# Patient Record
Sex: Male | Born: 1945 | Race: White | Hispanic: No
Health system: Midwestern US, Community
[De-identification: ages and names within clinical notes are randomized; demographics above are authoritative.]

## PROBLEM LIST (undated history)

## (undated) DIAGNOSIS — G4733 Obstructive sleep apnea (adult) (pediatric): Secondary | ICD-10-CM

## (undated) DIAGNOSIS — J449 Chronic obstructive pulmonary disease, unspecified: Secondary | ICD-10-CM

## (undated) DIAGNOSIS — K219 Gastro-esophageal reflux disease without esophagitis: Secondary | ICD-10-CM

## (undated) DIAGNOSIS — J42 Unspecified chronic bronchitis: Secondary | ICD-10-CM

---

## 2013-08-23 NOTE — Progress Notes (Signed)
PULMONARY/SLEEP MEDICINE    08/23/2013     CHIEF COMPLAINT: had a chief complaint of COPD.    HISTORY OF PRESENT ILLNESS: Donald Charles presents for had a chief complaint of COPD.   Doing well since his last visit.  Minimal cough.  No phlegm, wheezing, chest pain.  No sinus complaints.  No heartburn symptoms.  Using rescue inhaler about 10x/wk    Using O2 with machine with exertion  11-7 am  occ naps  Sleeping well with his machine.  Wakes up feeling rested.  No daytime fatigue.  Pressure in mask is comfortable.      Past Medical History   Diagnosis Date   ??? Hypertension    ??? Hyperlipidemia    ??? BPH (benign prostatic hyperplasia)    ??? COPD (chronic obstructive pulmonary disease)    ??? OSA on CPAP      No past surgical history on file.  Current Outpatient Prescriptions   Medication Sig Dispense Refill   ??? cpap machine kit by Does Not Apply route. At 12       ??? OXYGEN-AIR DELIVERY SYSTEMS (KEYSTONE + OXYGEN CONCENTRATOR) by Does Not Apply route. At 2L/min with exertion and at night       ??? albuterol (PROVENTIL HFA, VENTOLIN HFA, PROAIR HFA) 90 mcg/actuation inhaler Take 2 puffs by inhalation every four (4) hours as needed.  3 Inhaler  3   ??? fluticasone-salmeterol (ADVAIR DISKUS) 250-50 mcg/dose diskus inhaler Take 1 puff by inhalation every twelve (12) hours.  3 Inhaler  3   ??? furosemide (LASIX) 40 mg tablet Take  by mouth daily.       ??? fenofibrate nanocrystallized (TRICOR) 145 mg tablet Take  by mouth daily.       ??? irbesartan (AVAPRO) 300 mg tablet Take 300 mg by mouth nightly.       ??? metolazone (ZAROXOLYN) 2.5 mg tablet Take  by mouth daily.       ??? atorvastatin (LIPITOR) 20 mg tablet Take  by mouth daily.       ??? metoprolol succinate (TOPROL-XL) 100 mg XL tablet Take  by mouth daily.         I attest that current medications are reviewed and are accurate.    No Known Allergies  History     Social History   ??? Marital Status: DIVORCED     Spouse Name: N/A     Number of Children: N/A   ??? Years of Education: N/A      Social History Main Topics   ??? Smoking status: Former Smoker -- 1.00 packs/day for 30 years   ??? Smokeless tobacco: Not on file      Comment: quit 15 yrs ago   ??? Alcohol Use: Yes      Comment: occasional   ??? Drug Use: Not on file   ??? Sexually Active: Not on file     Other Topics Concern   ??? Not on file     Social History Narrative   ??? No narrative on file     Family History   Problem Relation Age of Onset   ??? Cancer Mother      lung       REVIEW OF SYSTEMS:   no dyspnea.  no hemoptysis.  no cough.  no orthopnea.  no wheeze.  no sputum production. no fever, sweats, or chills.  no unusual fatigue.  no loss of appetite.  no weight loss more than 5 lbs.  no headaches.  no ear aches.  no eye irritation.  no blurred or double vision.  no nose or sinus problems, including hay fever.  no dry eyes or dry mouth.  no snoring.  no breast discomfort.  no chest pain.  no irregular or rapid heart beats.  no heartburn or indigestion.  no difficulty swallowing or regurgitation.  no nausea or vomiting.  no abdominl pain.  no diarrhea.  no constipation.  no difficult or painful urination.  no frequent urination.  no swelling at the ankles.  no joint pains or muscle aches.  no fingers turn white and painful in the cold.  no back pain or neck pain.  no automobile accident or other serios injury.  no unusual dizziness, faintness or loss of consciousness.  no numbness or weakness of part of your body.  no anxiety.  no depression.        PHYSICAL EXAM: BP 136/74   Pulse 67   Ht 6' (1.829 m)   Wt 156.945 kg (346 lb)   BMI 46.92 kg/m2   SpO2 91%   General Appearance: NAD, pleasant, well built and nourished  HEENT: no thrush, no mucositis, TM's normal, turbinates normal, oropharynx normal  Oral Cavity: normal teeth, no lesions  Neck, Thyroid: supple, no lymphadenopathy, trachea at midline, no thyromegaly, JVP flat  Heart: regular rate and rhythm, S1, S2 without murmur.    Lungs: clear to auscultation, good air entry bilaterally.    Chest:  normal shape and expansion, no use of accessory muscles, clear to percussion, normal fremitus  Abdomen: soft, NT/ND, BS present, no masses palpated, no hepatosplenomegaly  Extremities: no cyanosis, no clubbing, no edema.    Peripheral pulses: normal (2+) bilaterally  Neurologic: no focal signs, awake and alert, oriented x 3, normal cranial nerves II-XII sensory and motor wnl, DTR 2 plus, gait normal, normal sensation and strength  Lymph nodes not palpable  Skin: warm, dry, normal, no rash  Back: no midline or CVA tenderness  Lymphatics: lymphoedema absent      LABS AND TESTING:   No results found for this or any previous visit.    ASSESSMENT    ICD-9-CM   1. COPD (chronic obstructive pulmonary disease) 496   2. Sleep apnea, obstructive 327.23           PLAN  10/14 patient seen for follow-up.  Patient is doing well since last time.  His breathing is stable at this time.  We will continue the same inhalers.  He is doing well with his CPAP machine.  I will give him an envelope to mail back his SD card and we will check his AHI data.  Follow-up in 6 months.  Patient does not want the flu shot or pneumonia shot  Orders Placed This Encounter   ??? albuterol (PROVENTIL HFA, VENTOLIN HFA, PROAIR HFA) 90 mcg/actuation inhaler   ??? fluticasone-salmeterol (ADVAIR DISKUS) 250-50 mcg/dose diskus inhaler           Follow-up Disposition:  Return in about 6 months (around 02/21/2014).  Thayer Dallas, MD

## 2013-08-23 NOTE — Patient Instructions (Signed)
Sleep Apnea: After Your Visit  Your Care Instructions  Sleep apnea means that you frequently stop breathing for 10 seconds or longer during sleep. It can be mild to severe, based on the number of times an hour that you stop breathing or have slowed breathing.  Blocked or narrowed airways in your nose, mouth, or throat can cause sleep apnea. Your airway can become blocked when your throat muscles and tongue relax during sleep.  You can treat sleep apnea at home by making lifestyle changes. You also can use a CPAP breathing machine that keeps tissues in the throat from blocking your airway. Or your doctor may suggest that you use a breathing device while you sleep. It helps keep your airway open. This could be a device that you put in your mouth. Other examples include strips or disks that you use on your nose. In some cases, surgery may be needed to remove enlarged tissues in the throat.  Follow-up care is a key part of your treatment and safety. Be sure to make and go to all appointments, and call your doctor if you are having problems. It's also a good idea to know your test results and keep a list of the medicines you take.  How can you care for yourself at home?  ?? Lose weight, if needed. It may reduce the number of times you stop breathing or have slowed breathing.  ?? Sleep on your side. It may stop mild apnea. If you tend to roll onto your back, sew a pocket in the back of your pajama top. Put a tennis ball into the pocket, and stitch the pocket shut. This will help keep you from sleeping on your back.  ?? Avoid alcohol and medicines such as sleeping pills and sedatives before bed.  ?? Do not smoke. Smoking can make sleep apnea worse. If you need help quitting, talk to your doctor about stop-smoking programs and medicines. These can increase your chances of quitting for good.  ?? Prop up the head of your bed 4 to 6 inches by putting bricks under the legs of the bed.  ?? Treat breathing problems, such as a stuffy  nose, caused by a cold or allergies.  ?? Try a continuous positive airway pressure (CPAP) breathing machine if your doctor recommends it. The machine keeps your airway open when you sleep.  ?? If CPAP does not work for you, ask your doctor if you can try other breathing machines. A bilevel positive airway pressure machine uses one type of air pressure for breathing in and another type for breathing out. Another device raises or lowers air pressure as needed while you breathe.  ?? Talk to your doctor if:  ?? Your nose feels dry or bleeds when you use one of these machines. You may need to increase moisture in the air. A humidifier may help.  ?? Your nose is runny or stuffy from using a breathing machine. Decongestants or a corticosteroid nasal spray may help.  ?? You are sleepy during the day and it gets in the way of the normal things you do. Do not drive when you are drowsy.  When should you call for help?  Watch closely for changes in your health, and be sure to contact your doctor if:  ?? You still have sleep apnea even though you have made lifestyle changes.  ?? You are thinking of trying a device such as CPAP.  ?? You are having problems using a CPAP or similar machine.     Where can you learn more?   Go to http://www.healthwise.net/[facility]  Enter J936 in the search box to learn more about "Sleep Apnea: After Your Visit."   ?? 2006-2014 Healthwise, Inc[employer]. Care instructions adapted under license by [facility] (which disclaims liability or warranty for this information). This care instruction is for use with your licensed healthcare professional. If you have questions about a medical condition or this instruction, always ask your healthcare professional. Healthwise, Incorporated disclaims any warranty or liability for your use of this information.  Content Version: 10.1.311062; Current as of: November 21, 2012

## 2013-09-05 NOTE — Telephone Encounter (Signed)
Please let the patient know that their sleep apnea is well controlled at their current setting.

## 2013-09-05 NOTE — Telephone Encounter (Signed)
Left message to discuss

## 2013-09-06 NOTE — Telephone Encounter (Signed)
Patient informed

## 2014-02-19 MED ORDER — ALBUTEROL SULFATE HFA 90 MCG/ACTUATION AEROSOL INHALER
90 mcg/actuation | RESPIRATORY_TRACT | Status: DC | PRN
Start: 2014-02-19 — End: 2014-05-28

## 2014-02-19 NOTE — Progress Notes (Signed)
PULMONARY/SLEEP MEDICINE    02/19/2014     CHIEF COMPLAINT: had concerns including COPD.    HISTORY OF PRESENT ILLNESS: Donald Charles presents for had concerns including COPD.    Doing well  No sob, cough, phlegm, wheezing  Using rescue inhaler about twice a day  Using O2 with exertion  Sleeping well at this time  occ naps  No trouble driving  Doesn't want pneumonia shot        Past Medical History   Diagnosis Date   ??? Hypertension    ??? Hyperlipidemia    ??? BPH (benign prostatic hyperplasia)    ??? COPD (chronic obstructive pulmonary disease) ([place])    ??? OSA on CPAP      History reviewed. No pertinent past surgical history.  Current Outpatient Prescriptions   Medication Sig Dispense Refill   ??? cpap machine kit by Does Not Apply route. At 12       ??? OXYGEN-AIR DELIVERY SYSTEMS (KEYSTONE + OXYGEN CONCENTRATOR) by Does Not Apply route. At 2L/min with exertion and at night       ??? albuterol (PROVENTIL HFA, VENTOLIN HFA, PROAIR HFA) 90 mcg/actuation inhaler Take 2 puffs by inhalation every four (4) hours as needed.  3 Inhaler  3   ??? fluticasone-salmeterol (ADVAIR DISKUS) 250-50 mcg/dose diskus inhaler Take 1 puff by inhalation every twelve (12) hours.  3 Inhaler  3   ??? furosemide (LASIX) 40 mg tablet Take  by mouth daily.       ??? fenofibrate nanocrystallized (TRICOR) 145 mg tablet Take  by mouth daily.       ??? irbesartan (AVAPRO) 300 mg tablet Take 300 mg by mouth nightly.       ??? metolazone (ZAROXOLYN) 2.5 mg tablet Take  by mouth daily.       ??? atorvastatin (LIPITOR) 20 mg tablet Take  by mouth daily.       ??? metoprolol succinate (TOPROL-XL) 100 mg XL tablet Take  by mouth daily.         I attest that current medications are reviewed and are accurate.    No Known Allergies  History     Social History   ??? Marital Status: DIVORCED     Spouse Name: N/A     Number of Children: N/A   ??? Years of Education: N/A     Social History Main Topics   ??? Smoking status: Former Smoker -- 1.00 packs/day for 30 years   ??? Smokeless tobacco:  Not on file      Comment: quit 15 yrs ago   ??? Alcohol Use: Yes      Comment: occasional   ??? Drug Use: Not on file   ??? Sexual Activity: Not on file     Other Topics Concern   ??? Not on file     Social History Narrative     Family History   Problem Relation Age of Onset   ??? Cancer Mother      lung       REVIEW OF SYSTEMS:   no dyspnea.  no hemoptysis.  no cough.  no orthopnea.  no wheeze.  no sputum production. no fever, sweats, or chills.  no unusual fatigue.  no loss of appetite.  no weight loss more than 5 lbs.  no headaches.  no ear aches.  no eye irritation.  no blurred or double vision.  no nose or sinus problems, including hay fever.  no dry eyes or dry mouth.  no  snoring.  no breast discomfort.  no chest pain.  no irregular or rapid heart beats.  no heartburn or indigestion.  no difficulty swallowing or regurgitation.  no nausea or vomiting.  no abdominl pain.  no diarrhea.  no constipation.  no difficult or painful urination.  no frequent urination.  no swelling at the ankles.  no joint pains or muscle aches.  no fingers turn white and painful in the cold.  no back pain or neck pain.  no automobile accident or other serios injury.  no unusual dizziness, faintness or loss of consciousness.  no numbness or weakness of part of your body.  no anxiety.  no depression.        PHYSICAL EXAM: BP 138/72    Ht 6' (1.829 m)    Wt 156.491 kg (345 lb)    BMI 46.78 kg/m2      SpO2 92%    General Appearance: NAD, pleasant, well built and nourished  HEENT: no thrush, no mucositis, TM's normal, turbinates normal, oropharynx normal  Oral Cavity: normal teeth, no lesions  Neck, Thyroid: supple, no lymphadenopathy, trachea at midline, no thyromegaly, JVP flat  Heart: regular rate and rhythm, S1, S2 without murmur.    Lungs: clear to auscultation, good air entry bilaterally.    Chest: normal shape and expansion, no use of accessory muscles, clear to percussion, normal fremitus  Abdomen: soft, NT/ND, BS present, no masses palpated,  no hepatosplenomegaly  Extremities: no cyanosis, no clubbing, no edema.    Peripheral pulses: normal (2+) bilaterally  Neurologic: no focal signs, awake and alert, oriented x 3, normal cranial nerves II-XII sensory and motor wnl, DTR 2 plus, gait normal, normal sensation and strength  Lymph nodes not palpable  Skin: warm, dry, normal, no rash  Back: no midline or CVA tenderness  Lymphatics: lymphoedema absent      LABS AND TESTING:   No results found for this or any previous visit.   4/15 compliance.  Age.  Eyes 0.3, 99%, 6 hours and 44 minutes    ASSESSMENT  10/14 patient seen for follow-up.  Patient is doing well since last time.  His breathing is stable at this time.  We will continue the same inhalers.  He is doing well with his CPAP machine.  I will give him an envelope to mail back his SD card and we will check his AHI data.  Follow-up in 6 months.  Patient does not want the flu shot or pneumonia shot      ICD-9-CM   1. COPD (chronic obstructive pulmonary disease) ([place]) 496   2. Sleep apnea 780.57           PLAN  02/19/2014 Patient seen for follow up.   Doing well since his last visit.  Breathing has been stable.  He doesn't want to do a spirometry yet.  We will repeat on his follow-up.  Sleep has been well.  We will continue with CPAP at nighttime.  AHI 0.3  Follow up in 3 months.  He doesn't want any immunizations for pneumonia.    Orders Placed This Encounter   ??? albuterol (PROVENTIL HFA, VENTOLIN HFA, PROAIR HFA) 90 mcg/actuation inhaler           Follow-up Disposition:  Return in about 3 months (around 05/21/2014).  Mora Appl, MD

## 2014-02-19 NOTE — Patient Instructions (Signed)
Learning About CPAP for Sleep Apnea  What is CPAP?     CPAP is a small machine that you use at home every night while you sleep. It increases air pressure in your throat to keep your airway open. When you have sleep apnea, this can help you sleep better so you feel much better. CPAP stands for "continuous positive airway pressure."  The CPAP machine will have one of the following:  ?? A mask that covers your nose and mouth  ?? Prongs that fit into your nose  ?? A mask that covers your nose only, the most common type. This type is called NCPAP. The N stands for "nasal."  Why is it done?  CPAP is usually the best treatment for obstructive sleep apnea. It is the first treatment choice and the most widely used. Your doctor may suggest CPAP if you have:  ?? Moderate to severe sleep apnea.  ?? Sleep apnea and coronary artery disease (CAD) or heart failure.  How does it help?  ?? CPAP can help you have more normal sleep, so you feel less sleepy and more alert during the daytime.  ?? CPAP may help keep heart failure or other heart problems from getting worse.  ?? CPAP may help lower your blood pressure.  ?? If you use CPAP, your bed partner may also sleep better because you are not snoring or restless.  What are the side effects?  Some people who use CPAP have:  ?? A dry or stuffy nose and a sore throat.  ?? Irritated skin on the face.  ?? Sore eyes.  ?? Bloating.  If you have any of these problems, work with your doctor to fix them. Here are some things you can try:  ?? Be sure the mask or nasal prongs fit well.  ?? See if your doctor can adjust the pressure of your CPAP.  ?? If your nose is dry, try a humidifier.  ?? If your nose is runny or stuffy, try decongestant medicine or a steroid nasal spray. Be safe with medicines. Read and follow all instructions on the label. Do not use the medicine longer than the label says.  If these things do not help, you might try a different type of machine. Some machines have air pressure that adjusts  on its own. Others have air pressures that are different when you breathe in than when you breathe out. This may reduce discomfort caused by too much pressure in your nose.   Where can you learn more?   Go to http://www.healthwise.net/[facility]  Enter X266 in the search box to learn more about "Learning About CPAP for Sleep Apnea."   ?? 2006-2015 Healthwise, Incorporated. Care instructions adapted under license by [facility] (which disclaims liability or warranty for this information). This care instruction is for use with your licensed healthcare professional. If you have questions about a medical condition or this instruction, always ask your healthcare professional. Healthwise, Inc[employer] disclaims any warranty or liability for your use of this information.  Content Version: 10.4.390249; Current as of: July 17, 2013

## 2014-05-28 MED ORDER — ALBUTEROL SULFATE HFA 90 MCG/ACTUATION AEROSOL INHALER
90 mcg/actuation | RESPIRATORY_TRACT | Status: DC | PRN
Start: 2014-05-28 — End: 2014-11-28

## 2014-05-28 NOTE — Progress Notes (Signed)
PULMONARY/SLEEP MEDICINE    05/28/2014     CHIEF COMPLAINT: had concerns including Breathing Problem.    HISTORY OF PRESENT ILLNESS: Donald Charles presents for had concerns including Breathing Problem.    Does get doe  Going for poss lap band  Little heavy with the breathing  Min cough. No congestion  No wheezing  Using rescue ihaler twice a day    Using O2.  Using cpap machine every night  Goes to bed now at 1:30-2 AM and offset at 9-10 AM  Wakes up rested  No snoring  occ naps      Past Medical History   Diagnosis Date   ??? Hypertension    ??? Hyperlipidemia    ??? BPH (benign prostatic hyperplasia)    ??? COPD (chronic obstructive pulmonary disease) ([place])    ??? OSA on CPAP      No past surgical history on file.  Current Outpatient Prescriptions   Medication Sig Dispense Refill   ??? albuterol (PROVENTIL HFA, VENTOLIN HFA, PROAIR HFA) 90 mcg/actuation inhaler Take 2 Puffs by inhalation every four (4) hours as needed. 3 Inhaler 3   ??? cpap machine kit by Does Not Apply route. At 12     ??? OXYGEN-AIR DELIVERY SYSTEMS (KEYSTONE + OXYGEN CONCENTRATOR) by Does Not Apply route. At 2L/min with exertion and at night     ??? fluticasone-salmeterol (ADVAIR DISKUS) 250-50 mcg/dose diskus inhaler Take 1 puff by inhalation every twelve (12) hours. 3 Inhaler 3   ??? furosemide (LASIX) 40 mg tablet Take  by mouth daily.     ??? fenofibrate nanocrystallized (TRICOR) 145 mg tablet Take  by mouth daily.     ??? irbesartan (AVAPRO) 300 mg tablet Take 300 mg by mouth nightly.     ??? metolazone (ZAROXOLYN) 2.5 mg tablet Take  by mouth daily.     ??? atorvastatin (LIPITOR) 20 mg tablet Take  by mouth daily.     ??? metoprolol succinate (TOPROL-XL) 100 mg XL tablet Take  by mouth daily.       I attest that current medications are reviewed and are accurate.    No Known Allergies  History     Social History   ??? Marital Status: DIVORCED     Spouse Name: N/A     Number of Children: N/A   ??? Years of Education: N/A     Social History Main Topics    ??? Smoking status: Former Smoker -- 1.00 packs/day for 30 years   ??? Smokeless tobacco: Not on file      Comment: quit 15 yrs ago   ??? Alcohol Use: Yes      Comment: occasional   ??? Drug Use: Not on file   ??? Sexual Activity: Not on file     Other Topics Concern   ??? Not on file     Social History Narrative     Family History   Problem Relation Age of Onset   ??? Cancer Mother      lung        REVIEW OF SYSTEMS:    dyspnea.   hemoptysis.   cough.   orthopnea.   wheeze.   sputum production.  fever, sweats, or chills.   unusual fatigue.   loss of appetite.   weight loss more than 5 lbs.   headaches.   ear aches.   eye irritation.   blurred or double vision.   nose or sinus problems, including hay fever.   dry eyes  or dry mouth.   snoring.   breast discomfort.   chest pain.   irregular or rapid heart beats.   heartburn or indigestion.   difficulty swallowing or regurgitation.   nausea or vomiting.   abdominl pain.   diarrhea.   constipation.   difficult or painful urination.   frequent urination.   swelling at the ankles.   joint pains or muscle aches.   fingers turn white and painful in the cold.   back pain or neck pain.   automobile accident or other [place] injury.   unusual dizziness, faintness or loss of consciousness.   numbness or weakness of part of your body.   anxiety.   depression.          PHYSICAL EXAM: BP 142/80 mmHg   Pulse 79   Ht 6' (1.829 m)   Wt 158.441 kg (349 lb 4.8 oz)   BMI 47.36 kg/m2   SpO2 96%   General Appearance: NAD, pleasant, well built and nourished  HEENT: no thrush, no mucositis, TM's normal, turbinates normal, oropharynx normal  Oral Cavity: normal teeth, no lesions  Neck, Thyroid: supple, no lymphadenopathy, trachea at midline, no thyromegaly, JVP flat  Heart: regular rate and rhythm, S1, S2 without murmur.    Lungs: clear to auscultation, good air entry bilaterally.    Chest: normal shape and expansion, no use of accessory muscles, clear to percussion, normal fremitus   Abdomen: soft, NT/ND, BS present, no masses palpated, no hepatosplenomegaly  Extremities: no cyanosis, no clubbing, no edema.    Peripheral pulses: normal (2+) bilaterally  Neurologic: no focal signs, awake and alert, oriented x 3, normal cranial nerves II-XII sensory and motor wnl, DTR 2 plus, gait normal, normal sensation and strength  Lymph nodes not palpable  Skin: warm, dry, normal, no rash  Back: no midline or CVA tenderness  Lymphatics: lymphoedema absent      LABS AND TESTING:   No results found for this or any previous visit.   9/13 CXR: enlarged cor, basilar atx 9/13 Spiro ratio 51, FEV1 1.1359 (36%), FVC 2.682 (55%) 9/13 MRI of abd: mult hepatic and renal cysts -benign, mild adrenal hyperplasia with no mass9/13 CT Chest: copd changes with apical blebs, possible PH, cad, 5.7 left renal mass -possibly a cyst, 1.4 cm liver mass-possibly cyst10/12/2011 9:14:23 AM > , PFTs ratio 43, FEV1 1.39 (43%), TLC 9.77 (139%), DLCO 18.7 (61%), DLCO/VA 3.88 (106%) 10/13 PSG: AHi 27, RDI 30, desat to 48%, no plms 12/13 titration titrated to 12, had some residual events on it, no PLMS     7/14 spirometry ratio 44, FEV1 1.507 (40%), FVC 3.463 (71%).    4/15 compliance.  AHI  0.3, 99%, 6 hours and 44 minutes  7/15 compliance.  AHI 0.1, 7 hours and 27 minutes, 100% usage, no leaks  7/15 spiro ratio 68, FEV1 1.41, 44%, FVC 3, 64%      ASSESSMENT  10/14 patient seen for follow-up.  Patient is doing well since last time.  His breathing is stable at this time.  We will continue the same inhalers.  He is doing well with his CPAP machine.  I will give him an envelope to mail back his SD card and we will check his AHI data.  Follow-up in 6 months.  Patient does not want the flu shot or pneumonia shot  02/19/2014 Patient seen for follow up.   Doing well since his last visit.  Breathing has been stable.  He doesn't want to  do a spirometry yet.  We will repeat on his follow-up.   Sleep has been well.  We will continue with CPAP at nighttime.  AHI 0.3  Follow up in 3 months.  He doesn't want any immunizations for pneumonia.        ICD-9-CM    1. COPD (chronic obstructive pulmonary disease) (HCC) 496 AMB POC SPIROMETRY   2. Sleep apnea 780.57 AMB POC SPIROMETRY           PLAN      05/28/2014 Patient seen for follow up.   Patient's been doing well since last time.  His breathing is stable.  Spirometry done in the office today shows his FEV1 is stable at 44%.  We will continue the same inhalers.  He does not want a pneumonia shot.  Sleep apnea is well controlled with an AHI less than 1.  We will continue to same settings.  I will see him in follow-up in 6 months        Orders Placed This Encounter   ??? AMB POC SPIROMETRY   ??? albuterol (PROVENTIL HFA, VENTOLIN HFA, PROAIR HFA) 90 mcg/actuation inhaler           Follow-up Disposition:  Return in about 6 months (around 11/28/2014).  Mora Appl, MD

## 2014-05-28 NOTE — Patient Instructions (Signed)
Chronic Obstructive Pulmonary Disease (COPD): After Your Visit  Your Care Instructions     Chronic obstructive pulmonary disease (COPD) is a general term for a group of lung diseases, including emphysema and chronic bronchitis. People with COPD have decreased airflow in and out of the lungs, which makes it hard to breathe. The airways also can get clogged with thick mucus. Cigarette smoking is a major cause of COPD.  Although there is no cure for COPD, you can slow its progress. Following your treatment plan and taking care of yourself can help you feel better and live longer.  Follow-up care is a key part of your treatment and safety. Be sure to make and go to all appointments, and call your doctor if you are having problems. It???s also a good idea to know your test results and keep a list of the medicines you take.  How can you care for yourself at home?  Staying healthy  ?? Do not smoke. This is the most important step you can take to prevent more damage to your lungs. If you need help quitting, talk to your doctor about stop-smoking programs and medicines. These can increase your chances of quitting for good.  ?? Avoid colds and flu. Get a pneumococcal vaccine shot. If you have had one before, ask your doctor whether you need a second dose. Get the flu vaccine every fall. If you must be around people with colds or the flu, wash your hands often.  ?? Avoid secondhand smoke, air pollution, and high altitudes. Also avoid cold, dry air and hot, humid air. Stay at home with your windows closed when air pollution is bad.  Medicines and oxygen therapy  ?? Take your medicines exactly as prescribed. Call your doctor if you think you are having a problem with your medicine.  ?? You may be taking medicines such as:  ?? Bronchodilators. These help open your airways and make breathing easier. Bronchodilators are either short-acting (work for 6 to 9 hours) or  long-acting (work for 24 hours). You inhale most bronchodilators, so they start to act quickly. Always carry your quick-relief inhaler with you in case you need it while you are away from home.  ?? Corticosteroids (prednisone, budesonide). These reduce airway inflammation. They come in pill or inhaled form. You must take these medicines every day for them to work well.  ?? A spacer may help you get more inhaled medicine to your lungs. Ask your doctor or pharmacist if a spacer is right for you. If it is, ask how to use it properly.  ?? Do not take any vitamins, over-the-counter medicine, or herbal products without talking to your doctor first.  ?? If your doctor prescribed antibiotics, take them as directed. Do not stop taking them just because you feel better. You need to take the full course of antibiotics.  ?? Oxygen therapy boosts the amount of oxygen in your blood and helps you breathe easier. Use the flow rate your doctor has recommended, and do not change it without talking to your doctor first.  Activity  ?? Get regular exercise. Walking is an easy way to get exercise. Start out slowly, and walk a little more each day.  ?? Pay attention to your breathing. You are exercising too hard if you cannot talk while you are exercising.  ?? Take short rest breaks when doing household chores and other activities.  ?? Learn breathing methods???such as breathing through pursed lips???to help you become less short of breath.  ??   If your doctor has not set you up with a pulmonary rehabilitation program, talk to him or her about whether rehab is right for you. Rehab includes exercise programs, education about your disease and how to manage it, help with diet and other changes, and emotional support.  Diet  ?? Eat regular, healthy meals. Use bronchodilators about 1 hour before you eat to make it easier to eat. Eat several small meals instead of three large ones. Drink beverages at the end of the meal. Avoid foods that are hard to chew.   ?? Eat foods that contain fat and protein so that you do not lose weight and muscle mass. These foods include ice cream, pudding, cheese, eggs, and peanut butter.  ?? Use less salt. Too much salt can cause you to retain fluids, which makes it harder to breathe. Do not add salt while you are cooking or at the table. Eat fewer processed foods and foods from restaurants, including fast foods. Use fresh or frozen foods instead of canned foods.  Mental health  ?? Talk to your family, friends, or a therapist about your feelings. It is normal to feel frightened, angry, hopeless, helpless, and even guilty. Talking openly about bad feelings can help you cope. If these feelings last, talk to your doctor.  When should you call for help?  Call 911 anytime you think you may need emergency care. For example, call if:  ?? You have severe trouble breathing.  Call your doctor now or seek immediate medical care if:  ?? You have new or worse trouble breathing.  ?? You cough up blood.  ?? You have a fever.  Watch closely for changes in your health, and be sure to contact your doctor if:  ?? You cough more deeply or more often, especially if you notice more mucus or a change in the color of your mucus.  ?? You have new or worse swelling in your legs or belly.  ?? You are not getting better as expected.   Where can you learn more?   Go to http://www.healthwise.net/[facility]  Enter X915 in the search box to learn more about "Chronic Obstructive Pulmonary Disease (COPD): After Your Visit."   ?? 2006-2015 Healthwise, Inc[employer]. Care instructions adapted under license by [employer] (which disclaims liability or warranty for this information). This care instruction is for use with your licensed healthcare professional. If you have questions about a medical condition or this instruction, always ask your healthcare professional. Healthwise, Incorporated disclaims any warranty or liability for your use of this information.   Content Version: 10.5.422740; Current as of: July 17, 2013

## 2014-08-05 ENCOUNTER — Inpatient Hospital Stay: Payer: MEDICARE

## 2014-08-05 MED ADMIN — lactated ringers infusion: INTRAVENOUS | @ 17:00:00 | NDC 00409795309

## 2014-08-05 MED ADMIN — propofol (DIPRIVAN) 10 mg/mL injection: INTRAVENOUS | @ 19:00:00 | NDC 00409469930

## 2014-08-05 NOTE — Anesthesia Pre-Procedure Evaluation (Signed)
Anesthetic History   No history of anesthetic complications            Review of Systems / Medical History  Patient summary reviewed, nursing notes reviewed and pertinent labs reviewed    Pulmonary    COPD: moderate    Sleep apnea: CPAP           Neuro/Psych              Cardiovascular    Hypertension: well controlled                   GI/Hepatic/Renal  Within defined limits              Endo/Other  Within defined limits           Other Findings              Physical Exam    Airway  Mallampati: III  TM Distance: 4 - 6 cm  Neck ROM: normal range of motion   Mouth opening: Normal     Cardiovascular  Regular rate and rhythm,  S1 and S2 normal,  no murmur, click, rub, or gallop             Dental  No notable dental hx       Pulmonary  Breath sounds clear to auscultation               Abdominal  Abdominal exam normal       Other Findings            Anesthetic Plan    ASA: 3  Anesthesia type: MAC          Induction: Intravenous  Anesthetic plan and risks discussed with: Patient

## 2014-08-05 NOTE — H&P (Signed)
Patient presents for esophagogastroduodenoscopy. The indication for the procedure is GERD and other foregut symptoms, s/p or preparing for bariatric surgery. The risks, benefits, and alternatives are discussed including the small risk of bleeding & perforation  Patient is scheduled for EGD with biopsy as part of their evaluation for bariatric surgery.    Patient is s/p:preop sleeve, OSA    OZH:YQMV    PSHX:     Med/allergies see Epic    Physical Exam:  Current vital-see chart  General: Well developed, well nourished 68 y.o. male in no acute distress  ENMT: normocephalic, atraumatic mouth:clear, no overt lesions, oral mucosa pink and moist  Neck: supple,  Respiratory: clear to auscultation bilaterally, no wheeze, rhonchi or rales,   Cardiovascular: Regular rate and rhythm, no murmurs,  Gastrointestinal: soft, nontender, nondistended, normoactive bowel sounds, no hernias, or masses, no peritoneal signs   Musculoskeletal: warm, well-perfused, no tenderness or swelling,  Neuro:  AAOX3, sensation and strength grossly intact and symmetrical      A: GERD    P: EGD with possible biopsy/dilatation    WM Janann August

## 2014-08-05 NOTE — Op Note (Signed)
[facility]  OPERATIVE REPORT                                            Name: Donald Charles, Donald Charles                                            MR#: [id number]                                            Account #: [id number]                                            Date of Adm: 08/05/2014        Facility [facility]  DocId [id number]  ChartScript-[id number]    DATE OF SURGERY: 08/05/2014    SURGEON: Pasha Broad, MD    PREOPERATIVE DIAGNOSES  1. Morbid obesity.  2. Gastroesophageal reflux disease.    POSTOPERATIVE DIAGNOSES  1. Morbid obesity.  2. Gastroesophageal reflux disease.    PROCEDURE PERFORMED: Esophagogastroduodenoscopy with  biopsy.    ANESTHESIOLOGIST:    ANESTHESIA: Conscious sedation.    COMPLICATIONS: None.    SPECIMENS: Biopsy of the antrum was obtained.    HISTORY AND INDICATIONS: Morbidly obese male, desires  surgical weight loss. In preparation for that, upper endoscopy is  indicated as he has never had an endoscopic evaluation previously.    DESCRIPTION OF PROCEDURE: Under conscious sedation, the  Olympus endoscope was passed through the mouth, esophagus,  stomach, pylorus, into the duodenum. He has antritis, a biopsy is  obtained. The pylorus and duodenum are normal. Retroflexed and  antegrade views do show a small sliding hiatal hernia with minimal  esophagitis. The proximal esophagus is normal. Hiatal hernia may  need to be repaired at the time of surgery.    ASSESSMENT/PLAN: Mild gastritis, otherwise no  contraindications to sleeve. Hiatal hernia repair will be an  intraoperative decision.        Hyland Mollenkopf MD    WW / CD  D:  08/05/2014   14:37  T:  08/05/2014   14:55  Job #:  [id number]              wm    CS Doc#:  [id number]                                  Page 1 of 2

## 2014-08-05 NOTE — Anesthesia Post-Procedure Evaluation (Signed)
Post-Anesthesia Evaluation and Assessment    Patient: Donald Charles MRN: [id number]  SSN: [id number]    Date of Birth: 06/09/1946  Age: 68 y.o.  Sex: male       Cardiovascular Function/Vital Signs  Visit Vitals   Item Reading   ??? BP 113/69 mmHg   ??? Pulse 75   ??? Resp 167   ??? Ht  (1.803 m)   ??? Wt 145.605 kg (321 lb)   ??? BMI 44.79 kg/m2   ??? SpO2 95%       Patient is status post MAC anesthesia for Procedure(s):  ESOPHAGOGASTRODUODENOSCOPY (EGD) WITH BIOPSY .    Nausea/Vomiting: None    Postoperative hydration reviewed and adequate.    Pain:  Pain Scale 1: Numeric (0 - 10) (08/05/14 1305)  Pain Intensity 1: 0 (08/05/14 1305)   Managed    Neurological Status:       At baseline    Mental Status and Level of Consciousness: Alert and oriented     Pulmonary Status:   O2 Device: Room air (08/05/14 1439)   Adequate oxygenation and airway patent    Complications related to anesthesia: None    Post-anesthesia assessment completed. No concerns    Signed By: Marzetta Board, CRNA     August 05, 2014

## 2014-08-05 NOTE — Discharge Summary (Signed)
S/p egd

## 2014-08-05 NOTE — Other (Signed)
Abdomen soft without tenderness noted upon palpation.

## 2014-08-05 NOTE — Op Note (Signed)
[facility]  OPERATIVE REPORT                                            Name: Donald Charles, Donald Charles                                            MR#: [id number]                                            Account #: [id number]                                            Date of Adm: 08/05/2014        Facility [facility]  DocId [id number]  ChartScript-[id number]    DATE OF SURGERY: 08/05/2014    SURGEON: Devin Going, MD    PREOPERATIVE DIAGNOSES  1. Morbid obesity.  2. Gastroesophageal reflux disease.    POSTOPERATIVE DIAGNOSES  1. Morbid obesity.  2. Gastroesophageal reflux disease.    PROCEDURE PERFORMED: Esophagogastroduodenoscopy with  biopsy.    ANESTHESIOLOGIST:    ANESTHESIA: Conscious sedation.    COMPLICATIONS: None.    SPECIMENS: Biopsy of the antrum was obtained.    HISTORY AND INDICATIONS: Morbidly obese male, desires  surgical weight loss. In preparation for that, upper endoscopy is  indicated as he has never had an endoscopic evaluation previously.    DESCRIPTION OF PROCEDURE: Under conscious sedation, the  Olympus endoscope was passed through the mouth, esophagus,  stomach, pylorus, into the duodenum. He has antritis, a biopsy is  obtained. The pylorus and duodenum are normal. Retroflexed and  antegrade views do show a small sliding hiatal hernia with minimal  esophagitis. The proximal esophagus is normal. Hiatal hernia may  need to be repaired at the time of surgery.    ASSESSMENT/PLAN: Mild gastritis, otherwise no  contraindications to sleeve. Hiatal hernia repair will be an  intraoperative decision.        Devin Going MD    [facility] / CD  D:  08/05/2014   14:37  T:  08/05/2014   14:55  Job #:  [id number]              wm    CS Doc#:  [id number]                                  Page 1 of 2

## 2014-08-06 MED FILL — PROPOFOL 10 MG/ML IV EMUL: 10 mg/mL | INTRAVENOUS | Qty: 20

## 2014-08-20 NOTE — Telephone Encounter (Signed)
faxed

## 2014-08-20 NOTE — Telephone Encounter (Signed)
Fax#[phone]  Donald Charles called and asked that patients PFT and Sleep study be faxed over to her. They said that sleep study is probable  from a few years ago.  Surgery is next week. But they need to have this all this afternoon. She would like a call if this is a problem.

## 2014-08-22 NOTE — H&P (Signed)
Bariatric Surgery History and Physical    Name:  Donald Charles  MRN:  [id number]    Subjective:     The patient is a 68 y.o. obese male scheduled for laparoscopic sleeve gastrectomy  Donald Charles has been overweight for 60 years. The patient has had numerous attempts at weight loss that have been unsuccessful. he presents for  laparoscopic sleeve gastrectomy     Past Medical hx:    Past Medical History   Diagnosis Date   ??? Hypertension    ??? Hyperlipidemia    ??? BPH (benign prostatic hyperplasia)    ??? COPD (chronic obstructive pulmonary disease) ([place])    ??? OSA on CPAP        Past Surgical hx:  Cataract surgery 2014    Current Medications: No current facility-administered medications for this encounter.  Current outpatient prescriptions: albuterol (PROVENTIL HFA, VENTOLIN HFA, PROAIR HFA) 90 mcg/actuation inhaler, Take 2 Puffs by inhalation every four (4) hours as needed. Pt. Wants ProAir HFA, Disp: 3 Inhaler, Rfl: 3;  cpap machine kit, by Does Not Apply route. At 12, Disp: , Rfl: ;  OXYGEN-AIR DELIVERY SYSTEMS (KEYSTONE + OXYGEN CONCENTRATOR), by Does Not Apply route. At 2L/min with exertion and at night, Disp: , Rfl:   fluticasone-salmeterol (ADVAIR DISKUS) 250-50 mcg/dose diskus inhaler, Take 1 puff by inhalation every twelve (12) hours., Disp: 3 Inhaler, Rfl: 3;  furosemide (LASIX) 40 mg tablet, Take  by mouth daily., Disp: , Rfl: ;  fenofibrate nanocrystallized (TRICOR) 145 mg tablet, Take  by mouth daily., Disp: , Rfl: ;  irbesartan (AVAPRO) 300 mg tablet, Take 300 mg by mouth nightly., Disp: , Rfl:   atorvastatin (LIPITOR) 20 mg tablet, Take  by mouth daily., Disp: , Rfl: ;  metoprolol succinate (TOPROL-XL) 100 mg XL tablet, Take  by mouth daily., Disp: , Rfl:     Allergies:  NKA    Social hx:  History     Social History   ??? Marital Status: DIVORCED     Spouse Name: N/A     Number of Children: N/A   ??? Years of Education: N/A     Occupational History   ??? Not on file.     Social History Main Topics    ??? Smoking status: Former Smoker -- 1.00 packs/day for 30 years   ??? Smokeless tobacco: Not on file      Comment: quit 15 yrs ago   ??? Alcohol Use: Yes      Comment: occasional   ??? Drug Use: No   ??? Sexual Activity: Not on file     Other Topics Concern   ??? Not on file     Social History Narrative       Family hx:   Family History   Problem Relation Age of Onset   ??? Cancer Mother      lung       ROS:  Negative edema in lower ext.    Physical Exam: Ht: 70"  Baseline weight: 345  BMI: 49.5  Current weight 322.5  BMI: 46.2  Neck: 19"  Waist: 53"  Hips: 49"  Ratio: 1.08  R calf: 19.5"  L calf: 18"    BP 145/75 P 68 R 18    General appearance  alert, cooperative, no distress, appears stated age   Head  Normocephalic, without obvious abnormality, atraumatic   Eyes  conjunctivae/corneas clear. PERRL, EOM's intact. Fundi benign   Ears  normal TM's and external ear canals AU  Nose Nares normal. Septum midline. Mucosa normal. No drainage or sinus tenderness.   Throat Lips, mucosa, and tongue normal. Teeth and gums normal   Neck supple, symmetrical, trachea midline, no adenopathy, thyroid: not enlarged, symmetric, no tenderness/mass/nodules, no carotid bruit and no JVD   Back   symmetric, no curvature. ROM normal. No CVA tenderness   Lungs   clear to auscultation bilaterally   Chest wall  no tenderness   Heart  regular rate and rhythm, S1, S2 normal, no murmur, click, rub or gallop   Abdomen   soft, non-tender. Bowel sounds normal. No masses,  No organomegaly   Genitalia  Normal male   Rectal  Normal tone, normal prostate, no masses or tenderness  Guaiac negative stool   Extremities extremities normal, atraumatic, no cyanosis or edema   Pulses 2+ and symmetric   Skin Skin color, texture, turgor normal. No rashes or lesions   Lymph nodes Cervical, supraclavicular, and axillary nodes normal.   Neurologic Normal         Review of Pertinent Testing: Was done by the Surgeon and is on file     Impression: Clinically Obese   Past Medical History   Diagnosis Date   ??? Hypertension    ??? Hyperlipidemia    ??? BPH (benign prostatic hyperplasia)    ??? COPD (chronic obstructive pulmonary disease) ([place])    ??? OSA on CPAP        Plan:laparoscopic sleeve gastrectomy on 08/26/14    Continue: Metoprolol (take AM of sugery), Advair, Albuterol, Pantoprazole, Irbesartan (hold AM of surgery)  Hold: Lipitor, Tricor, and Lasix post op  Clear liquids 24 hours prior to surgery  Rx for Dilaudid, Compazine for post op use.       Rosiland Oz, NP  08/22/2014  11:18 AM

## 2014-08-23 NOTE — Other (Signed)
Patty in Dr. Gean Quintstrowitz's office notified of need for orders to be placed in [facility].

## 2014-08-26 ENCOUNTER — Inpatient Hospital Stay
Admit: 2014-08-26 | Discharge: 2014-08-27 | Disposition: A | Discharge status: Home / Self Care | Payer: MEDICARE | Attending: Family Medicine | Admitting: Family Medicine

## 2014-08-26 LAB — EKG, 12 LEAD, INITIAL
Atrial Rate: 67 {beats}/min
Calculated P Axis: 65 degrees
Calculated R Axis: 87 degrees
Calculated T Axis: 63 degrees
P-R Interval: 188 ms
Q-T Interval: 446 ms
QRS Duration: 136 ms
QTC Calculation (Bezet): 471 ms
Ventricular Rate: 67 {beats}/min

## 2014-08-26 LAB — PTT: aPTT: 23.7 s (ref 22.0–29.0)

## 2014-08-26 MED ORDER — ONDANSETRON (PF) 4 MG/2 ML INJECTION
4 mg/2 mL | INTRAMUSCULAR | Status: AC
Start: 2014-08-26 — End: ?

## 2014-08-26 MED ORDER — ROCURONIUM 10 MG/ML IV
10 mg/mL | INTRAVENOUS | Status: AC
Start: 2014-08-26 — End: ?

## 2014-08-26 MED ORDER — ONDANSETRON (PF) 4 MG/2 ML INJECTION
4 mg/2 mL | INTRAMUSCULAR | Status: DC | PRN
Start: 2014-08-26 — End: 2014-08-26
  Administered 2014-08-26: 17:00:00 via INTRAVENOUS

## 2014-08-26 MED ORDER — ONDANSETRON (PF) 4 MG/2 ML INJECTION
4 mg/2 mL | INTRAMUSCULAR | Status: DC | PRN
Start: 2014-08-26 — End: 2014-08-26

## 2014-08-26 MED ORDER — SUCCINYLCHOLINE CHLORIDE 20 MG/ML INJECTION
20 mg/mL | INTRAMUSCULAR | Status: DC | PRN
Start: 2014-08-26 — End: 2014-08-26
  Administered 2014-08-26: 16:00:00 via INTRAVENOUS

## 2014-08-26 MED ORDER — PROPOFOL 10 MG/ML IV EMUL
10 mg/mL | INTRAVENOUS | Status: DC | PRN
Start: 2014-08-26 — End: 2014-08-26
  Administered 2014-08-26: 16:00:00 via INTRAVENOUS

## 2014-08-26 MED ORDER — CEFAZOLIN 2 G IN 100 ML 0.9% NS
2 gram/100 mL | Freq: Once | INTRAVENOUS | Status: AC
Start: 2014-08-26 — End: 2014-08-26
  Administered 2014-08-26: 16:00:00 via INTRAVENOUS

## 2014-08-26 MED ORDER — LACTATED RINGERS IV
INTRAVENOUS | Status: DC
Start: 2014-08-26 — End: 2014-08-27
  Administered 2014-08-26 – 2014-08-27 (×3): via INTRAVENOUS

## 2014-08-26 MED ORDER — CEFAZOLIN 2 G IN 100 ML 0.9% NS
2 gram/100 mL | INTRAVENOUS | Status: AC
Start: 2014-08-26 — End: ?

## 2014-08-26 MED ORDER — DEXAMETHASONE SODIUM PHOSPHATE 4 MG/ML IJ SOLN
4 mg/mL | INTRAMUSCULAR | Status: DC | PRN
Start: 2014-08-26 — End: 2014-08-26
  Administered 2014-08-26: 16:00:00 via INTRAVENOUS

## 2014-08-26 MED ORDER — GLYCOPYRROLATE 0.2 MG/ML IJ SOLN
0.2 mg/mL | INTRAMUSCULAR | Status: DC | PRN
Start: 2014-08-26 — End: 2014-08-26
  Administered 2014-08-26: 17:00:00 via INTRAVENOUS

## 2014-08-26 MED ORDER — LACTATED RINGERS BOLUS IV
Freq: Once | INTRAVENOUS | Status: DC
Start: 2014-08-26 — End: 2014-08-26

## 2014-08-26 MED ORDER — ALBUTEROL SULFATE 0.083 % (0.83 MG/ML) SOLN FOR INHALATION
2.5 mg /3 mL (0.083 %) | RESPIRATORY_TRACT | Status: DC | PRN
Start: 2014-08-26 — End: 2014-08-26
  Administered 2014-08-26: 16:00:00 via RESPIRATORY_TRACT

## 2014-08-26 MED ORDER — HYDROMORPHONE (PF) 1 MG/ML IJ SOLN
1 mg/mL | INTRAMUSCULAR | Status: DC | PRN
Start: 2014-08-26 — End: 2014-08-27

## 2014-08-26 MED ORDER — APREPITANT 40 MG CAP
40 mg | Freq: Once | ORAL | Status: AC
Start: 2014-08-26 — End: 2014-08-26
  Administered 2014-08-26: 14:00:00 via ORAL

## 2014-08-26 MED ORDER — FENTANYL CITRATE (PF) 50 MCG/ML IJ SOLN
50 mcg/mL | INTRAMUSCULAR | Status: DC | PRN
Start: 2014-08-26 — End: 2014-08-26
  Administered 2014-08-26 (×5): via INTRAVENOUS

## 2014-08-26 MED ORDER — ROCURONIUM 10 MG/ML IV
10 mg/mL | INTRAVENOUS | Status: DC | PRN
Start: 2014-08-26 — End: 2014-08-26
  Administered 2014-08-26 (×4): via INTRAVENOUS

## 2014-08-26 MED ORDER — LACTATED RINGERS IV
INTRAVENOUS | Status: DC | PRN
Start: 2014-08-26 — End: 2014-08-26
  Administered 2014-08-26: 14:00:00 via INTRAVENOUS

## 2014-08-26 MED ORDER — HYDROMORPHONE 2 MG TAB
2 mg | ORAL | Status: DC | PRN
Start: 2014-08-26 — End: 2014-08-26

## 2014-08-26 MED ORDER — PROPOFOL 10 MG/ML IV EMUL
10 mg/mL | INTRAVENOUS | Status: AC
Start: 2014-08-26 — End: ?

## 2014-08-26 MED ORDER — ALBUTEROL SULFATE HFA 90 MCG/ACTUATION AEROSOL INHALER
90 mcg/actuation | RESPIRATORY_TRACT | Status: DC | PRN
Start: 2014-08-26 — End: 2014-08-27

## 2014-08-26 MED ORDER — ENOXAPARIN 40 MG/0.4 ML SUB-Q SYRINGE
40 mg/0.4 mL | Freq: Every day | SUBCUTANEOUS | Status: DC
Start: 2014-08-26 — End: 2014-08-27
  Administered 2014-08-27: 13:00:00 via SUBCUTANEOUS

## 2014-08-26 MED ORDER — ALBUTEROL SULFATE 0.083 % (0.83 MG/ML) SOLN FOR INHALATION
2.5 mg /3 mL (0.083 %) | RESPIRATORY_TRACT | Status: DC | PRN
Start: 2014-08-26 — End: 2014-08-26

## 2014-08-26 MED ORDER — NEOSTIGMINE METHYLSULFATE 1 MG/ML INJECTION
1 mg/mL | INTRAMUSCULAR | Status: AC
Start: 2014-08-26 — End: ?

## 2014-08-26 MED ORDER — GLYCOPYRROLATE 0.2 MG/ML IJ SOLN
0.2 mg/mL | INTRAMUSCULAR | Status: AC
Start: 2014-08-26 — End: ?

## 2014-08-26 MED ORDER — SODIUM CHLORIDE 0.9% BOLUS IV
0.9 % | INTRAVENOUS | Status: DC | PRN
Start: 2014-08-26 — End: 2014-08-27

## 2014-08-26 MED ORDER — FENTANYL CITRATE (PF) 50 MCG/ML IJ SOLN
50 mcg/mL | INTRAMUSCULAR | Status: AC
Start: 2014-08-26 — End: ?

## 2014-08-26 MED ORDER — LACTATED RINGERS IV
INTRAVENOUS | Status: DC
Start: 2014-08-26 — End: 2014-08-26

## 2014-08-26 MED ORDER — ALBUTEROL SULFATE 0.083 % (0.83 MG/ML) SOLN FOR INHALATION
2.5 mg /3 mL (0.083 %) | RESPIRATORY_TRACT | Status: AC
Start: 2014-08-26 — End: ?

## 2014-08-26 MED ORDER — NEOSTIGMINE METHYLSULFATE 0.5 MG/ML INJECTION
0.5 mg/mL | INTRAMUSCULAR | Status: DC | PRN
Start: 2014-08-26 — End: 2014-08-26
  Administered 2014-08-26: 17:00:00 via INTRAVENOUS

## 2014-08-26 MED ORDER — METOCLOPRAMIDE 5 MG/ML IJ SOLN
5 mg/mL | INTRAMUSCULAR | Status: DC | PRN
Start: 2014-08-26 — End: 2014-08-26

## 2014-08-26 MED ORDER — ACETAMINOPHEN 1,000 MG/100 ML (10 MG/ML) IV
1000 mg/100 mL (10 mg/mL) | INTRAVENOUS | Status: AC
Start: 2014-08-26 — End: ?

## 2014-08-26 MED ORDER — PANTOPRAZOLE 40 MG IV SOLR
40 mg | Freq: Every day | INTRAVENOUS | Status: DC
Start: 2014-08-26 — End: 2014-08-27
  Administered 2014-08-27: 13:00:00 via INTRAVENOUS

## 2014-08-26 MED ORDER — BUPIVACAINE (PF) 0.25 % (2.5 MG/ML) IJ SOLN
0.25 % (2.5 mg/mL) | INTRAMUSCULAR | Status: AC
Start: 2014-08-26 — End: ?

## 2014-08-26 MED ORDER — HYDROMORPHONE (PF) 1 MG/ML IJ SOLN
1 mg/mL | Freq: Once | INTRAMUSCULAR | Status: AC
Start: 2014-08-26 — End: 2014-08-26
  Administered 2014-08-26: 19:00:00 via INTRAVENOUS

## 2014-08-26 MED ORDER — MIDAZOLAM 1 MG/ML IJ SOLN
1 mg/mL | INTRAMUSCULAR | Status: AC
Start: 2014-08-26 — End: ?

## 2014-08-26 MED ORDER — FENTANYL CITRATE (PF) 50 MCG/ML IJ SOLN
50 mcg/mL | INTRAMUSCULAR | Status: DC | PRN
Start: 2014-08-26 — End: 2014-08-26
  Administered 2014-08-26 (×3): via INTRAVENOUS

## 2014-08-26 MED ORDER — SODIUM CHLORIDE 0.9 % IJ SYRG
INTRAMUSCULAR | Status: DC | PRN
Start: 2014-08-26 — End: 2014-08-26

## 2014-08-26 MED ORDER — METOPROLOL SUCCINATE SR 100 MG 24 HR TAB
100 mg | Freq: Every day | ORAL | Status: DC
Start: 2014-08-26 — End: 2014-08-27

## 2014-08-26 MED ORDER — METHYLENE BLUE (ANTIDOTE) 1 % IJ SOLN
1 % (0 mg/mL) | INTRAVENOUS | Status: AC
Start: 2014-08-26 — End: ?

## 2014-08-26 MED ORDER — ACETAMINOPHEN 1,000 MG/100 ML (10 MG/ML) IV
1000 mg/100 mL (10 mg/mL) | INTRAVENOUS | Status: DC | PRN
Start: 2014-08-26 — End: 2014-08-26
  Administered 2014-08-26: 16:00:00 via INTRAVENOUS

## 2014-08-26 MED ORDER — MIDAZOLAM 1 MG/ML IJ SOLN
1 mg/mL | INTRAMUSCULAR | Status: DC | PRN
Start: 2014-08-26 — End: 2014-08-26
  Administered 2014-08-26: 14:00:00 via INTRAVENOUS

## 2014-08-26 MED ORDER — FENTANYL CITRATE (PF) 50 MCG/ML IJ SOLN
50 mcg/mL | INTRAMUSCULAR | Status: AC
Start: 2014-08-26 — End: 2014-08-26
  Administered 2014-08-26: 18:00:00 via INTRAVENOUS

## 2014-08-26 MED ORDER — BUPIVACAINE (PF) 0.25 % (2.5 MG/ML) IJ SOLN
0.25 % (2.5 mg/mL) | INTRAMUSCULAR | Status: DC | PRN
Start: 2014-08-26 — End: 2014-08-26
  Administered 2014-08-26: 17:00:00 via SUBCUTANEOUS

## 2014-08-26 MED ORDER — ACETAMINOPHEN 1,000 MG/100 ML (10 MG/ML) IV
1000 mg/100 mL (10 mg/mL) | Freq: Four times a day (QID) | INTRAVENOUS | Status: AC
Start: 2014-08-26 — End: 2014-08-27
  Administered 2014-08-26 – 2014-08-27 (×2): via INTRAVENOUS

## 2014-08-26 MED ORDER — FLUTICASONE-SALMETEROL 250 MCG-50 MCG/DOSE DISK DEVICE FOR INHALATION
250-50 mcg/dose | Freq: Two times a day (BID) | RESPIRATORY_TRACT | Status: DC
Start: 2014-08-26 — End: 2014-08-27
  Administered 2014-08-27 (×2): via RESPIRATORY_TRACT

## 2014-08-26 MED ORDER — SUCCINYLCHOLINE CHLORIDE 20 MG/ML INJECTION
20 mg/mL | INTRAMUSCULAR | Status: AC
Start: 2014-08-26 — End: ?

## 2014-08-26 MED ORDER — DIPHENHYDRAMINE HCL 50 MG/ML IJ SOLN
50 mg/mL | INTRAMUSCULAR | Status: DC | PRN
Start: 2014-08-26 — End: 2014-08-26

## 2014-08-26 MED ORDER — ENOXAPARIN 40 MG/0.4 ML SUB-Q SYRINGE
40 mg/0.4 mL | SUBCUTANEOUS | Status: DC
Start: 2014-08-26 — End: 2014-08-26
  Administered 2014-08-26: 14:00:00 via SUBCUTANEOUS

## 2014-08-26 MED ORDER — MEPERIDINE (PF) 25 MG/ML INJ SOLUTION
25 mg/ml | Freq: Once | INTRAMUSCULAR | Status: DC
Start: 2014-08-26 — End: 2014-08-26

## 2014-08-26 MED ORDER — METHYLENE BLUE (ANTIDOTE) 1 % IJ SOLN
1 % (0 mg/mL) | INTRAVENOUS | Status: DC | PRN
Start: 2014-08-26 — End: 2014-08-26
  Administered 2014-08-26: 17:00:00 via INTRAVENOUS

## 2014-08-26 MED ORDER — ONDANSETRON (PF) 4 MG/2 ML INJECTION
4 mg/2 mL | Freq: Four times a day (QID) | INTRAMUSCULAR | Status: DC | PRN
Start: 2014-08-26 — End: 2014-08-27

## 2014-08-26 MED FILL — FENTANYL CITRATE (PF) 50 MCG/ML IJ SOLN: 50 mcg/mL | INTRAMUSCULAR | Qty: 2

## 2014-08-26 MED FILL — BUPIVACAINE (PF) 0.25 % (2.5 MG/ML) IJ SOLN: 0.25 % (2.5 mg/mL) | INTRAMUSCULAR | Qty: 60

## 2014-08-26 MED FILL — LACTATED RINGERS IV: INTRAVENOUS | Qty: 500

## 2014-08-26 MED FILL — METHYLENE BLUE (ANTIDOTE) 1 % IJ SOLN: 1 % (0 mg/mL) | INTRAVENOUS | Qty: 1

## 2014-08-26 MED FILL — LACTATED RINGERS IV: INTRAVENOUS | Qty: 1000

## 2014-08-26 MED FILL — ROCURONIUM 10 MG/ML IV: 10 mg/mL | INTRAVENOUS | Qty: 5

## 2014-08-26 MED FILL — BUPIVACAINE (PF) 0.25 % (2.5 MG/ML) IJ SOLN: 0.25 % (2.5 mg/mL) | INTRAMUSCULAR | Qty: 30

## 2014-08-26 MED FILL — SODIUM CHLORIDE 0.9 % IV: INTRAVENOUS | Qty: 1000

## 2014-08-26 MED FILL — OFIRMEV 1,000 MG/100 ML (10 MG/ML) INTRAVENOUS SOLUTION: 1000 mg/100 mL (10 mg/mL) | INTRAVENOUS | Qty: 100

## 2014-08-26 MED FILL — QUELICIN 20 MG/ML INJECTION SOLUTION: 20 mg/mL | INTRAMUSCULAR | Qty: 10

## 2014-08-26 MED FILL — GLYCOPYRROLATE 0.2 MG/ML IJ SOLN: 0.2 mg/mL | INTRAMUSCULAR | Qty: 5

## 2014-08-26 MED FILL — ADVAIR DISKUS 250 MCG-50 MCG/DOSE POWDER FOR INHALATION: 250-50 mcg/dose | RESPIRATORY_TRACT | Qty: 14

## 2014-08-26 MED FILL — NEOSTIGMINE METHYLSULFATE 1 MG/ML INJECTION: 1 mg/mL | INTRAMUSCULAR | Qty: 10

## 2014-08-26 MED FILL — METOPROLOL SUCCINATE SR 100 MG 24 HR TAB: 100 mg | ORAL | Qty: 1

## 2014-08-26 MED FILL — CEFAZOLIN 2 G IN 100 ML 0.9% NS: 2 gram/100 mL | INTRAVENOUS | Qty: 100

## 2014-08-26 MED FILL — ALBUTEROL SULFATE 0.083 % (0.83 MG/ML) SOLN FOR INHALATION: 2.5 mg /3 mL (0.083 %) | RESPIRATORY_TRACT | Qty: 1

## 2014-08-26 MED FILL — BD POSIFLUSH NORMAL SALINE 0.9 % INJECTION SYRINGE: INTRAMUSCULAR | Qty: 10

## 2014-08-26 MED FILL — ONDANSETRON (PF) 4 MG/2 ML INJECTION: 4 mg/2 mL | INTRAMUSCULAR | Qty: 2

## 2014-08-26 MED FILL — PROPOFOL 10 MG/ML IV EMUL: 10 mg/mL | INTRAVENOUS | Qty: 20

## 2014-08-26 MED FILL — MIDAZOLAM 1 MG/ML IJ SOLN: 1 mg/mL | INTRAMUSCULAR | Qty: 2

## 2014-08-26 MED FILL — LOVENOX 40 MG/0.4 ML SUBCUTANEOUS SYRINGE: 40 mg/0.4 mL | SUBCUTANEOUS | Qty: 0.4

## 2014-08-26 MED FILL — HYDROMORPHONE (PF) 1 MG/ML IJ SOLN: 1 mg/mL | INTRAMUSCULAR | Qty: 1

## 2014-08-26 MED FILL — APREPITANT 40 MG CAP: 40 mg | ORAL | Qty: 1

## 2014-08-26 NOTE — Anesthesia Pre-Procedure Evaluation (Addendum)
Anesthetic History   No history of anesthetic complications            Review of Systems / Medical History  Patient summary reviewed, nursing notes reviewed and pertinent labs reviewed    Pulmonary    COPD: moderate    Sleep apnea: CPAP           Neuro/Psych   Within defined limits           Cardiovascular    Hypertension: well controlled              Exercise tolerance: >4 METS     GI/Hepatic/Renal     GERD: well controlled           Endo/Other        Morbid obesity     Other Findings            Physical Exam    Airway  Mallampati: II  TM Distance: 4 - 6 cm  Neck ROM: normal range of motion   Mouth opening: Normal     Cardiovascular  Regular rate and rhythm,  S1 and S2 normal,  no murmur, click, rub, or gallop             Dental    Dentition: Lower partial plate and Full upper dentures     Pulmonary  Breath sounds clear to auscultation               Abdominal  GI exam deferred       Other Findings            Anesthetic Plan    ASA: 3  Anesthesia type: general and regional - TAP block          Induction: Intravenous and RSI  Anesthetic plan and risks discussed with: Patient

## 2014-08-26 NOTE — Anesthesia Post-Procedure Evaluation (Signed)
Post-Anesthesia Evaluation and Assessment    Patient: Donald AlconRussell Valido MRN: [id number]  SSN: [id number]    Date of Birth: February 13, 1946  Age: 68 y.o.  Sex: male       Cardiovascular Function/Vital Signs  Visit Vitals   Item Reading   ??? BP 147/84 mmHg   ??? Pulse 74   ??? Temp 36.6 ??C (97.9 ??F)   ??? Resp 16   ??? Ht 5\' 11"  (1.803 m)   ??? Wt 140.978 kg (310 lb 12.8 oz)   ??? BMI 43.37 kg/m2   ??? SpO2 94%       Patient is status post general, regional anesthesia for Procedure(s):  GASTRECTOMY SLEEVE LAPAROSCOPIC   HERNIA HIATAL REPAIR LAPAROSCOPIC.    Nausea/Vomiting: None    Postoperative hydration reviewed and adequate.    Pain:  Pain Scale 1: Numeric (0 - 10) (08/26/14 0857)  Pain Intensity 1: 0 (08/26/14 0857)   Managed    Neurological Status:   Neuro (WDL): Within Defined Limits (08/26/14 14780938)   At baseline    Mental Status and Level of Consciousness: Alert and oriented     Pulmonary Status:   O2 Device: Nasal cannula (08/26/14 1336)   Adequate oxygenation and airway patent    Complications related to anesthesia: None    Post-anesthesia assessment completed. No concerns    Signed By: Alveta HeimlichJody Cinque Begley, DO     August 26, 2014

## 2014-08-26 NOTE — Other (Signed)
1611- Pt arrived to Unit from PACU, in NAD, VSS, Afeb, RN will continue to monitor.

## 2014-08-26 NOTE — Other (Signed)
Bedside shift change report given to R. Lim, RN (oncoming nurse) by Holly Hesse, RN (offgoing nurse). Report included the following information SBAR, Intake/Output, MAR and Recent Results.

## 2014-08-26 NOTE — Brief Op Note (Signed)
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 GASTRECTOMY SLEEVE LAPAROSCOPIC   2. HERNIA HIATAL REPAIR LAPAROSCOPIC  Surgeon(s) and Role:     * Johny ShearsMatthew B Conway Fedora, MD - Primary     * Dimas Chyleamon E Rivera, MD - Assisting  Anesthesia: General   Estimated Blood Loss: 30cc  Specimens:   ID Type Source Tests Collected by Time Destination   A : portion of stomach Preservative   Johny ShearsMatthew B Leaira Fullam, MD 08/26/2014 12:57 PM Pathology      Findings: Significant Hiatal Hernia with some stomach in the mediastinum  Complications: None  Implants: * No implants in log *

## 2014-08-26 NOTE — Other (Signed)
Received pt s/p laparoscopic sleeve gastrectomy and hiatal hernia repair. Pt wake alert oriented x 3. complaining with 10/10 pain level fentanyl was given as ordered with good effects. Pt has 5 laparoscopic incisions in the abdomen, ice pack was applied as ordered. Safety maintained assisted as needed. Will transfer to 4 [facility] when meet criteria.

## 2014-08-26 NOTE — Other (Signed)
TRANSFER - OUT REPORT:    Verbal report [facility] on Vergia AlconRussell Aughenbaugh  being transferred to [address] for routine post - op       Report consisted of patient???s Situation, Background, Assessment and   Recommendations(SBAR).     Information from the following report(s) SBAR, OR Summary, Procedure Summary, Intake/Output and MAR was reviewed with the receiving nurse.    Opportunity for questions and clarification was provided.      Patient transported with:   O2 @ 2 liters  Tech

## 2014-08-26 NOTE — Progress Notes (Signed)
JAMES B COFFEY administered [facility] [employer] Pettigrew on 08/26/2014.

## 2014-08-26 NOTE — Op Note (Signed)
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 GASTRECTOMY SLEEVE LAPAROSCOPIC   2. HERNIA HIATAL REPAIR LAPAROSCOPIC  Surgeon(s) and Role:     * Johny ShearsMatthew B Amram Maya, MD - Primary     * Dimas Chyleamon E Rivera, MD - Assisting  Anesthesia: General   Estimated Blood Loss: 30cc  Specimens:   ID Type Source Tests Collected by Time Destination   A : portion of stomach Preservative   Johny ShearsMatthew B Ashlie Mcmenamy, MD 08/26/2014 12:57 PM Pathology      Findings: Significant Hiatal Hernia with some stomach in the mediastinum  Complications: None  Implants: * No implants in log *    INDICATIONS FOR PROCEDURE: The patient is a 68 y.o. year-old male  with morbid obesity, who went through the [facility]  preoperative bariatric surgery workup protocol and achieved all  of his preop clearances and understanding the risks and benefits,  elected to undergo laparoscopic sleeve gastrectomy. The patient was also noted to have a hiatal hernia during his pre-op work-up    DESCRIPTION OF PROCEDURE: The patient was brought to the  operating room, placed on the OR table in the usual supine  position. After appropriate IV sedation was administered, the  patient was intubated without difficulty. A TAP block had been  performed by anesthesia under ultrasound guidance prior to entering the OR. The  patient was prepped and draped in the usual sterile fashion. A   Veress needle was inserted in the left side of the abdomen. The  abdomen insufflated to 15 mmHg without difficulty. A 12 mm  incision was made above the umbilicus through which a 12 mm  optical trocar was placed. The Veress needle was removed. That  incision extended to 12 mm, and a 12 mm trocar placed. A  5 mm incision was made laterally in the left side of the abdomen,  and a 5 mm trocar placed through there. A 12 mm incision made in  the right side of the abdomen, and a 12 mm trocar placed  through that. A 5 mm incision was made in the epigastric area,  through which a 5 mm trocar was placed to create a tract for  the [facility] liver retractor, which was then inserted and used  to retract the left lobe of the liver. The patient was then  placed in reverse Trendelenburg. Attention was turned towards the  greater curvature of the stomach. The LigaSure was used to  transect some of the attachments to the greater curvature of the  stomach and access the lesser sac. Once this was done, the  dissection was taken distally to approximately 4 cm from the  pylorus and then proximally all the way up around the greater  curvature, taking it down carefully off of the spleen, all the  way until the left crus was arrived at, and the stomach could be  seen to be free from adhesions posteriorly as well all the way to  the left crus.    The pars flaccida was entered with the  LigaSure, and that   dissection was taken all the way to the right crus. Some blunt   dissection was carried forth between the esophagus and stomach,   and the right crus of diaphragm, and a space was created   underneath the esophagus, taking care to identify the posterior   vagus nerve. The esophagus was feed from the crura in 360 degrees and a significant amount of stomach and some esophagus was brought back into the abdomen.      Once this was done, the orogastric tube was  removed, and a 40-French bougie was placed along the lesser   curvature. The IDrive Endo-GIA purple load with Buttress was used to create the  first firing, a regular purple load was used for the second firing and then multiple tan loads were fired along side of the bougie, all the way through the angle of His. The staple  line was inspected once the transection was completed. Good  hemostasis was seen to have been achieved. Two 2-0 Vicryls were used to imbricate a part of the staple line at the top of the sleeve. The 12 mm trocar in  the left side of the abdomen was removed, and an Alexis wound  protector placed through this incision and the remnant stomach  was brought out through there. The bougie was removed, and an  orogastric tube was replaced inside the sleeve gastrectomy, and  using an instrument to occlude the distal stomach, 60 mL of  methylene blue were instilled, and the sleeve was seen to distend  quite nicely and no extravasation of methylene blue was noted.    Once the test was complete, the esophagus and stomach were retracted superolaterally, and two 0 Ethibond sutures were used to reapproximate the posterior crura.    The abdomen was then copiously irrigated. The liver retractor was then  removed, and then all of the ports were removed and the abdomen  was desufflated. The fascia of the extraction site was closed with a 0-Vicryl. All the skin incisions were closed with  subcuticular 4-0 Monocryl. The patient tolerated the procedure  well. There were no complications. The patient was extubated,  taken to PACU in stable condition.

## 2014-08-26 NOTE — Anesthesia Procedure Notes (Signed)
Peripheral Block    Start time: 08/26/2014 10:04 AM  End time: 08/26/2014 10:09 AM  Block Type: left and right TAP  Reason for block: at surgeon's request and post-op pain management  Staffing  Anesthesiologist: Levaughn Puccinelli A  Other anesthesia staff: TORMA, DANIEL S  Performed by: anesthesiologist   Prep  Risks and benefits discussed with the patient and plans are to proceed  Site marked, Timeout performed  Monitoring: standard ASA monitoring, continuous pulse ox, frequent vital sign checks, heart rate and oxygen  Injection Technique: single-shot  Procedures: ultrasound guided  Patient was placed in supine position  Prep Solution(s): chlorhexidine  Needle  Needle: 20G Stimuplex  Needle localization: ultrasound guidance    Medication Injected: 60mL 0.25% bupivacaine    Assessment  Injection Assessment: no intravascular symptoms, negative aspiration for blood and incremental injection every 5 mL  Patient tolerated without any apparent complications  Additional Notes  No peritoneal puncture

## 2014-08-26 NOTE — Progress Notes (Signed)
enoxaparin (LOVENOX) injection 40 mg     Need crcl  Need plt

## 2014-08-26 NOTE — Interval H&P Note (Signed)
H&P Update:  Donald Charles was seen and examined.  History and physical has been reviewed. There have been no significant clinical changes since the completion of the originally dated History and Physical.    Signed By: Johny ShearsMatthew B Canesha Tesfaye, MD     August 26, 2014 7:38 AM

## 2014-08-26 NOTE — Anesthesia Procedure Notes (Signed)
Peripheral Block    Start time: 08/26/2014 10:04 AM  End time: 08/26/2014 10:09 AM  Block Type: left and right TAP  Reason for block: at surgeon's request and post-op pain management  Staffing  Anesthesiologist: Yehuda SavannahMACK, JENNIFER A  Other anesthesia staff: Mikki HarborMA, DANIEL S  Performed by: anesthesiologist   Prep  Risks and benefits discussed with the patient and plans are to proceed  Site marked, Timeout performed  Monitoring: standard ASA monitoring, continuous pulse ox, frequent vital sign checks, heart rate and oxygen  Injection Technique: single-shot  Procedures: ultrasound guided  Patient was placed in supine position  Prep Solution(s): chlorhexidine  Needle  Needle: 20G Stimuplex  Needle localization: ultrasound guidance    Medication Injected: 60mL 0.25% bupivacaine    Assessment  Injection Assessment: no intravascular symptoms, negative aspiration for blood and incremental injection every 5 mL  Patient tolerated without any apparent complications  Additional Notes  No peritoneal puncture

## 2014-08-26 NOTE — Op Note (Signed)
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 GASTRECTOMY SLEEVE LAPAROSCOPIC   2. HERNIA HIATAL REPAIR LAPAROSCOPIC  Surgeon(s) and Role:     * Johny ShearsMatthew B Ostrowitz, MD - Primary     * Dimas Chyleamon E Rivera, MD - Assisting  Anesthesia: General   Estimated Blood Loss: 30cc  Specimens:   ID Type Source Tests Collected by Time Destination   A : portion of stomach Preservative   Johny ShearsMatthew B Ostrowitz, MD 08/26/2014 12:57 PM Pathology      Findings: Significant Hiatal Hernia with some stomach in the mediastinum  Complications: None  Implants: * No implants in log *    INDICATIONS FOR PROCEDURE: The patient is a 68 y.o. year-old male  with morbid obesity, who went through the [facility]  preoperative bariatric surgery workup protocol and achieved all  of his preop clearances and understanding the risks and benefits,  elected to undergo laparoscopic sleeve gastrectomy. The patient was also noted to have a hiatal hernia during his pre-op work-up    DESCRIPTION OF PROCEDURE: The patient was brought to the  operating room, placed on the OR table in the usual supine  position. After appropriate IV sedation was administered, the  patient was intubated without difficulty. A TAP block had been  performed by anesthesia under ultrasound guidance prior to entering the OR. The  patient was prepped and draped in the usual sterile fashion. A  Veress needle was inserted in the left side  of the abdomen. The  abdomen insufflated to 15 mmHg without difficulty. A 12 mm  incision was made above the umbilicus through which a 12 mm  optical trocar was placed. The Veress needle was removed. That  incision extended to 12 mm, and a 12 mm trocar placed. A  5 mm incision was made laterally in the left side of the abdomen,  and a 5 mm trocar placed through there. A 12 mm incision made in  the right side of the abdomen, and a 12 mm trocar placed  through that. A 5 mm incision was made in the epigastric area,  through which a 5 mm trocar was placed to create a tract for  the [facility] liver retractor, which was then inserted and used  to retract the left lobe of the liver. The patient was then  placed in reverse Trendelenburg. Attention was turned towards the  greater curvature of the stomach. The LigaSure was used to  transect some of the attachments to the greater curvature of the  stomach and access the lesser sac. Once this was done, the  dissection was taken distally to approximately 4 cm from the  pylorus and then proximally all the way up around the greater  curvature, taking it down carefully off of the spleen, all the  way until the left crus was arrived at, and the stomach could be  seen to be free from adhesions posteriorly as well all the way to  the left crus.    The pars flaccida was entered with the  LigaSure, and that   dissection was taken all the way to the right crus. Some blunt   dissection was carried forth between the esophagus and stomach,   and the right crus of diaphragm, and a space was created   underneath the esophagus, taking care to identify the posterior   vagus nerve. The esophagus was feed from the crura in 360 degrees and a significant amount of stomach and some esophagus was brought back into the abdomen.      Once this was done, the orogastric tube was  removed, and a 40-French bougie was placed along the lesser  curvature. The IDrive Endo-GIA purple load with Buttress was used  to create the  first firing, a regular purple load was used for the second firing and then multiple tan loads were fired along side of the bougie, all the way through the angle of His. The staple  line was inspected once the transection was completed. Good  hemostasis was seen to have been achieved. Two 2-0 Vicryls were used to imbricate a part of the staple line at the top of the sleeve. The 12 mm trocar in  the left side of the abdomen was removed, and an Alexis wound  protector placed through this incision and the remnant stomach  was brought out through there. The bougie was removed, and an  orogastric tube was replaced inside the sleeve gastrectomy, and  using an instrument to occlude the distal stomach, 60 mL of  methylene blue were instilled, and the sleeve was seen to distend  quite nicely and no extravasation of methylene blue was noted.    Once the test was complete, the esophagus and stomach were retracted superolaterally, and two 0 Ethibond sutures were used to reapproximate the posterior crura.    The abdomen was then copiously irrigated. The liver retractor was then  removed, and then all of the ports were removed and the abdomen  was desufflated. The fascia of the extraction site was closed with a 0-Vicryl. All the skin incisions were closed with  subcuticular 4-0 Monocryl. The patient tolerated the procedure  well. There were no complications. The patient was extubated,  taken to PACU in stable condition.

## 2014-08-27 LAB — CBC W/O DIFF
HCT: 42.9 % (ref 42.0–52.0)
HGB: 13.6 g/dL — ABNORMAL LOW (ref 14.0–18.0)
MCH: 30.2 PG (ref 27.0–35.0)
MCHC: 31.7 g/dL (ref 30.7–37.3)
MCV: 95.1 FL — ABNORMAL HIGH (ref 81.0–94.0)
MPV: 11.2 FL (ref 9.2–11.8)
PLATELET: 217 10*3/uL (ref 130–400)
RBC: 4.51 M/uL — ABNORMAL LOW (ref 4.70–6.00)
RDW: 14.5 % — ABNORMAL HIGH (ref 11.5–14.0)
WBC: 10.9 10*3/uL — ABNORMAL HIGH (ref 4.8–10.6)

## 2014-08-27 LAB — METABOLIC PANEL, BASIC
Anion gap: 12 mmol/L (ref 10–20)
BUN: 26 mg/dL — ABNORMAL HIGH (ref 7–18)
CO2: 31 mmol/L (ref 21–32)
Calcium: 8.8 mg/dL (ref 8.5–10.1)
Chloride: 101 mmol/L (ref 98–107)
Creatinine: 1.3 mg/dL (ref 0.6–1.3)
GFR est AA: >60 mL/min/{1.73_m2} (ref >60)
GFR est non-AA: 58 mL/min/{1.73_m2} — ABNORMAL LOW (ref >60)
Glucose: 95 mg/dL (ref 74–106)
Potassium: 4.4 mmol/L (ref 3.5–5.1)
Sodium: 139 mmol/L (ref 136–145)

## 2014-08-27 MED FILL — METOPROLOL SUCCINATE SR 100 MG 24 HR TAB: 100 mg | ORAL | Qty: 1

## 2014-08-27 MED FILL — SODIUM CHLORIDE 0.9 % INJECTION: INTRAMUSCULAR | Qty: 10

## 2014-08-27 MED FILL — OFIRMEV 1,000 MG/100 ML (10 MG/ML) INTRAVENOUS SOLUTION: 1000 mg/100 mL (10 mg/mL) | INTRAVENOUS | Qty: 100

## 2014-08-27 MED FILL — LOVENOX 40 MG/0.4 ML SUBCUTANEOUS SYRINGE: 40 mg/0.4 mL | SUBCUTANEOUS | Qty: 0.4

## 2014-08-27 NOTE — Other (Signed)
Verbal shift change report given to e mulligan- williams rn (oncoming nurse) by - JARVIS LIM, RN   (offgoing nurse).  Report given with SBAR, Kardex, MAR and Recent Results.

## 2014-08-27 NOTE — Progress Notes (Signed)
Donald Charles is a 68 y.o. 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  Denies any nausea or vomiting.  Tolerating liquids well.  Pain well controlled.    Abdomen benign.  Incisions with dressings C/D/I    Patient Vitals for the past 4 hrs:   Temp Pulse Resp BP SpO2   08/27/14 0415 97.9 ??F (36.6 ??C) (!) 52 18 142/48 mmHg 97 %         Recent Results (from the past 12 hour(s))   CBC W/O DIFF    Collection Time: 08/27/14  4:40 AM   Result Value Ref Range    WBC 10.9 (H) 4.8 - 10.6 K/uL    RBC 4.51 (L) 4.70 - 6.00 M/uL    HGB 13.6 (L) 14.0 - 18.0 g/dL    HCT 16.142.9 09.642.0 - 04.552.0 %    MCV 95.1 (H) 81.0 - 94.0 FL    MCH 30.2 27.0 - 35.0 PG    MCHC 31.7 30.7 - 37.3 g/dL    RDW 40.914.5 (H) 81.111.5 - 14.0 %    PLATELET 217 130 - 400 K/uL    MPV 11.2 9.2 - 11.8 FL   METABOLIC PANEL, BASIC    Collection Time: 08/27/14  4:40 AM   Result Value Ref Range    Sodium 139 136 - 145 mmol/L    Potassium 4.4 3.5 - 5.1 mmol/L    Chloride 101 98 - 107 mmol/L    CO2 31 21 - 32 mmol/L    Anion gap 12 10 - 20 mmol/L    Glucose 95 74 - 106 mg/dL    BUN 26 (H) 7 - 18 mg/dL    Creatinine 1.3 0.6 - 1.3 mg/dL    GFR est AA >91>60 >47>60 WG/NFA/2.13Y8ml/min/1.73m2    GFR est non-AA 58 (L) >60 ml/min/1.6673m2    Calcium 8.8 8.5 - 10.1 mg/dL       Assessment/Plan:  Doing well.  HLIV.  D/C home

## 2014-08-27 NOTE — Discharge Summary (Signed)
The patient is a 68 y.o. year-old male who underwent a Laparoscopic Sleeve Gastrectomy with Hiatal Hernia Repair on 08/27/23 , had an unremarkable post-operative course and is being discharged on POD #1.    The patient will follow up in the [facility] office in 1 week.

## 2014-08-27 NOTE — Progress Notes (Signed)
Spoke with Dr. Monna Famstrowitz in reference to patient Donald Charles and medication. Donald Charles was 50 and metoprolol was held. Dr. Davina PokeInstructed patient to restart metoprolol on Thursday. Instructed patient to follow up with PCP about medications.

## 2014-08-27 NOTE — Progress Notes (Signed)
Patient was alert and oriented ambulating in room. Patient denied pain and is voiding.  Tolerated 3 oz of water  per hour. No complaints of nausea/vomiting. Incentive spirometer encouraged. RN discussed all discharge information, explained all meds, follow up instructions and diet. Gave information on side effects of new medications. Wheelchair ordered, patient waiting in room with family.

## 2014-08-29 MED FILL — GLYCOPYRROLATE 0.2 MG/ML IJ SOLN: 0.2 mg/mL | INTRAMUSCULAR | Qty: 1

## 2014-08-29 MED FILL — QUELICIN 20 MG/ML INJECTION SOLUTION: 20 mg/mL | INTRAMUSCULAR | Qty: 140

## 2014-08-29 MED FILL — METHYLENE BLUE (ANTIDOTE) 1 % IJ SOLN: 1 % (0 mg/mL) | INTRAVENOUS | Qty: 60

## 2014-08-29 MED FILL — OFIRMEV 1,000 MG/100 ML (10 MG/ML) INTRAVENOUS SOLUTION: 1000 mg/100 mL (10 mg/mL) | INTRAVENOUS | Qty: 100

## 2014-08-29 MED FILL — ALBUTEROL SULFATE 0.083 % (0.83 MG/ML) SOLN FOR INHALATION: 2.5 mg /3 mL (0.083 %) | RESPIRATORY_TRACT | Qty: 1

## 2014-08-29 MED FILL — DEXAMETHASONE SODIUM PHOSPHATE 4 MG/ML IJ SOLN: 4 mg/mL | INTRAMUSCULAR | Qty: 4

## 2014-08-31 MED FILL — METHYLENE BLUE (ANTIDOTE) 1 % IJ SOLN: 1 % (0 mg/mL) | INTRAVENOUS | Qty: 6

## 2014-09-13 MED ORDER — FLUTICASONE-SALMETEROL 250 MCG-50 MCG/DOSE DISK DEVICE FOR INHALATION
250-50 mcg/dose | Freq: Two times a day (BID) | RESPIRATORY_TRACT | Status: DC
Start: 2014-09-13 — End: 2015-08-14

## 2014-09-13 NOTE — Telephone Encounter (Signed)
Sent to the pharmacy

## 2014-09-13 NOTE — Telephone Encounter (Signed)
Patient called and said he needs to have script renewed for Advair 250/50. He is [facility]

## 2014-11-28 ENCOUNTER — Ambulatory Visit: Admit: 2014-11-28 | Discharge: 2014-11-28 | Payer: MEDICARE | Attending: Internal Medicine | Primary: Family Medicine

## 2014-11-28 DIAGNOSIS — J449 Chronic obstructive pulmonary disease, unspecified: Secondary | ICD-10-CM

## 2014-11-28 MED ORDER — ALBUTEROL SULFATE HFA 90 MCG/ACTUATION AEROSOL INHALER
90 mcg/actuation | RESPIRATORY_TRACT | Status: DC | PRN
Start: 2014-11-28 — End: 2016-08-23

## 2014-11-28 NOTE — Patient Instructions (Signed)
Eating Healthy Foods: Care Instructions  Your Care Instructions  Eating healthy foods can help lower your risk for disease. Healthy food gives you energy and keeps your heart strong, your brain active, your muscles working, and your bones strong.  A healthy diet includes a variety of foods from the basic food groups: grains, vegetables, fruits, milk and milk products, and meat and beans. Some people may eat more of their favorite foods from only one food group and, as a result, miss getting the nutrients they need. So, it is important to pay attention not only to what you eat but also to what you are missing from your diet. You can eat a healthy, balanced diet by making a few small changes.  Follow-up care is a key part of your treatment and safety. Be sure to make and go to all appointments, and call your doctor if you are having problems. It???s also a good idea to know your test results and keep a list of the medicines you take.  How can you care for yourself at home?  Look at what you eat  ?? Keep a food diary for a week or two and record everything you eat or drink. Track the number of servings you eat from each food group.  ?? For a balanced diet every day, eat a variety of:  ?? 6 or more ounce-equivalents of grains, such as cereals, breads, crackers, rice, or pasta, every day. An ounce-equivalent is 1 slice of bread, 1 cup of ready-to-eat cereal, or ?? cup of cooked rice, cooked pasta, or cooked cereal.  ?? 2?? cups of vegetables, especially:  ?? Dark-[place] vegetables such as broccoli and spinach.  ?? Orange vegetables such as carrots and sweet potatoes.  ?? Dry beans (such as pinto and kidney beans) and peas (such as lentils).  ?? 2 cups of fresh, frozen, or canned fruit. A small apple or 1 banana or orange equals 1 cup.  ?? 3 cups of nonfat or low-fat milk, yogurt, or other milk products.  ?? 5?? ounces of meat and beans, such as chicken, fish, lean meat, beans,  nuts, and seeds. One egg, 1 tablespoon of peanut butter, ?? ounce nuts or seeds, or ?? cup of cooked beans equals 1 ounce of meat.  ?? Learn how to read food labels for serving sizes and ingredients. Fast-food and convenience-food meals often contain few or no fruits or vegetables. Make sure you eat some fruits and vegetables to make the meal more nutritious.  ?? Look at your food diary. For each food group, add up what you have eaten and then divide the total by the number of days. This will give you an idea of how much you are eating from each food group. See if you can find some ways to change your diet to make it more healthy.  Start small  ?? Do not try to make dramatic changes to your diet all at once. You might feel that you are missing out on your favorite foods and then be more likely to fail.  ?? Start slowly, and gradually change your habits. Try some of the following:  ?? Use whole wheat bread instead of white bread.  ?? Use nonfat or low-fat milk instead of whole milk.  ?? Eat brown rice instead of white rice, and eat whole wheat pasta instead of white-flour pasta.  ?? Try low-fat cheeses and low-fat yogurt.  ?? Add more fruits and vegetables to meals and have them for snacks.  ??   Add lettuce, tomato, cucumber, and onion to sandwiches.  ?? Add fruit to yogurt and cereal.  Enjoy food  ?? You can still eat your favorite foods. You just may need to eat less of them. If your favorite foods are high in fat, salt, and sugar, limit how often you eat them, but do not cut them out entirely.  ?? Eat a wide variety of foods.  Make healthy choices when eating out  ?? The type of restaurant you choose can help you make healthy choices. Even fast-food chains are now offering more low-fat or healthier choices on the menu.  ?? Choose smaller portions, or take half of your meal home.  ?? When eating out, try:  ?? A veggie pizza with a whole wheat crust or grilled chicken (instead of sausage or pepperoni).   ?? Pasta with roasted vegetables, grilled chicken, or marinara sauce instead of cream sauce.  ?? A vegetable wrap or grilled chicken wrap.  ?? Broiled or poached food instead of fried or breaded items.  Make healthy choices easy  ?? Buy packaged, prewashed, ready-to-eat fresh vegetables and fruits, such as baby carrots, salad mixes, and chopped or shredded broccoli and cauliflower.  ?? Buy packaged, presliced fruits, such as melon or pineapple.  ?? Choose 100% fruit or vegetable juice instead of soda. Limit juice intake to 4 to 6 oz (?? to ?? cup) a day.  ?? Blend low-fat yogurt, fruit juice, and canned or frozen fruit to make a smoothie for breakfast or a snack.   Where can you learn more?   Go to http://www.healthwise.net/[facility]  Enter T756 in the search box to learn more about "Eating Healthy Foods: Care Instructions."   ?? 2006-2015 Healthwise, Incorporated. Care instructions adapted under license by [employer] (which disclaims liability or warranty for this information). This care instruction is for use with your licensed healthcare professional. If you have questions about a medical condition or this instruction, always ask your healthcare professional. Healthwise, Incorporated disclaims any warranty or liability for your use of this information.  Content Version: 10.7.482551; Current as of: September 21, 2013

## 2014-11-28 NOTE — Progress Notes (Signed)
PULMONARY/SLEEP MEDICINE    11/28/2014     CHIEF COMPLAINT: had concerns including COPD.    HISTORY OF PRESENT ILLNESS: Donald Charles presents for had concerns including COPD.      Down 90 lbs  Sleeping well with the machine  Runs out of water  Runs of water every night and then gets hot  Pressure feels ok  getes very dry  Sleeps from 12 to 9 AM.  Feels rested on waking up  Has not needed replacement parts        No sob, cough, phlegm,   occ wheezing  Not using rescue inhaler          Past Medical History   Diagnosis Date   ??? Hypertension    ??? Hyperlipidemia    ??? BPH (benign prostatic hyperplasia)    ??? COPD (chronic obstructive pulmonary disease) ([place])    ??? OSA on CPAP      No past surgical history on file.  Current Outpatient Prescriptions   Medication Sig Dispense Refill   ??? fluticasone-salmeterol (ADVAIR DISKUS) 250-50 mcg/dose diskus inhaler Take 1 Puff by inhalation every twelve (12) hours. 3 Inhaler 3   ??? albuterol (PROVENTIL HFA, VENTOLIN HFA, PROAIR HFA) 90 mcg/actuation inhaler Take 2 Puffs by inhalation every four (4) hours as needed. Pt. Wants ProAir HFA 3 Inhaler 3   ??? cpap machine kit by Does Not Apply route. At 12     ??? OXYGEN-AIR DELIVERY SYSTEMS (KEYSTONE + OXYGEN CONCENTRATOR) by Does Not Apply route. At 2L/min with exertion and at night     ??? metoprolol succinate (TOPROL-XL) 100 mg XL tablet Take  by mouth daily.       I attest that current medications are reviewed and are accurate.    No Known Allergies  History     Social History   ??? Marital Status: DIVORCED     Spouse Name: N/A     Number of Children: N/A   ??? Years of Education: N/A     Social History Main Topics   ??? Smoking status: Former Smoker -- 1.00 packs/day for 30 years   ??? Smokeless tobacco: Never Used      Comment: quit 15 yrs ago   ??? Alcohol Use: Yes      Comment: occasional   ??? Drug Use: No   ??? Sexual Activity: Not on file     Other Topics Concern   ??? Not on file     Social History Narrative     Family History    Problem Relation Age of Onset   ??? Cancer Mother      lung        REVIEW OF SYSTEMS:    dyspnea.   hemoptysis.   cough.   orthopnea.   wheeze.   sputum production.  fever, sweats, or chills.   unusual fatigue.   loss of appetite.   weight loss more than 5 lbs.   headaches.   ear aches.   eye irritation.   blurred or double vision.   nose or sinus problems, including hay fever.   dry eyes or dry mouth.   snoring.   breast discomfort.   chest pain.   irregular or rapid heart beats.   heartburn or indigestion.   difficulty swallowing or regurgitation.   nausea or vomiting.   abdominl pain.   diarrhea.   constipation.   difficult or painful urination.   frequent urination.   swelling at the ankles.  joint pains or muscle aches.   fingers turn white and painful in the cold.   back pain or neck pain.   automobile accident or other [place] injury.   unusual dizziness, faintness or loss of consciousness.   numbness or weakness of part of your body.   anxiety.   depression.          PHYSICAL EXAM: BP 134/72 mmHg   Ht '5\' 11"'  (1.803 m)   Wt 121.11 kg (267 lb)   BMI 37.26 kg/m2   SpO2 89%   General Appearance: NAD, pleasant, well built and nourished  HEENT: no thrush, no mucositis, TM's normal, turbinates normal, oropharynx normal  Oral Cavity: normal teeth, no lesions  Neck, Thyroid: supple, no lymphadenopathy, trachea at midline, no thyromegaly, JVP flat  Heart: regular rate and rhythm, S1, S2 without murmur.    Lungs: clear to auscultation, good air entry bilaterally.    Chest: normal shape and expansion, no use of accessory muscles, clear to percussion, normal fremitus  Abdomen: soft, NT/ND, BS present, no masses palpated, no hepatosplenomegaly  Extremities: no cyanosis, no clubbing, no edema.    Peripheral pulses: normal (2+) bilaterally  Neurologic: no focal signs, awake and alert, oriented x 3, normal cranial nerves II-XII sensory and motor wnl, DTR 2 plus, gait normal, normal sensation and strength   Lymph nodes not palpable  Skin: warm, dry, normal, no rash  Back: no midline or CVA tenderness  Lymphatics: lymphoedema absent      LABS AND TESTING:   Results for orders placed or performed during the hospital encounter of 08/26/14   PTT   Result Value Ref Range    aPTT 23.7 22.0 - 29.0 SEC   CBC W/O DIFF   Result Value Ref Range    WBC 10.9 (H) 4.8 - 10.6 K/uL    RBC 4.51 (L) 4.70 - 6.00 M/uL    HGB 13.6 (L) 14.0 - 18.0 g/dL    HCT 42.9 42.0 - 52.0 %    MCV 95.1 (H) 81.0 - 94.0 FL    MCH 30.2 27.0 - 35.0 PG    MCHC 31.7 30.7 - 37.3 g/dL    RDW 14.5 (H) 11.5 - 14.0 %    PLATELET 217 130 - 400 K/uL    MPV 11.2 9.2 - 16.1 FL   METABOLIC PANEL, BASIC   Result Value Ref Range    Sodium 139 136 - 145 mmol/L    Potassium 4.4 3.5 - 5.1 mmol/L    Chloride 101 98 - 107 mmol/L    CO2 31 21 - 32 mmol/L    Anion gap 12 10 - 20 mmol/L    Glucose 95 74 - 106 mg/dL    BUN 26 (H) 7 - 18 mg/dL    Creatinine 1.3 0.6 - 1.3 mg/dL    GFR est AA >60 >60 ml/min/1.82m    GFR est non-AA 58 (L) >60 ml/min/1.750m   Calcium 8.8 8.5 - 10.1 mg/dL   EKG, 12 LEAD, INITIAL   Result Value Ref Range    Ventricular Rate 67 BPM    Atrial Rate 67 BPM    P-R Interval 188 ms    QRS Duration 136 ms    Q-T Interval 446 ms    QTC Calculation (Bezet) 471 ms    Calculated P Axis 65 degrees    Calculated R Axis 87 degrees    Calculated T Axis 63 degrees    Diagnosis  Sinus rhythm with premature supraventricular complexes  Right bundle branch block  Abnormal ECG  No previous ECGs available  Confirmed by Najovits MD, Mitzi Hansen [phone]) on 08/26/2014 4:42:47 PM        9/13 CXR: enlarged cor, basilar atx 9/13 Spiro ratio 51, FEV1 1.1359 (36%), FVC 2.682 (55%) 9/13 MRI of abd: mult hepatic and renal cysts -benign, mild adrenal hyperplasia with no mass9/13 CT Chest: copd changes with apical blebs, possible PH, cad, 5.7 left renal mass -possibly a cyst, 1.4 cm liver mass-possibly cyst10/12/2011 9:14:23 AM > , PFTs ratio 43,  FEV1 1.39 (43%), TLC 9.77 (139%), DLCO 18.7 (61%), DLCO/VA 3.88 (106%) 10/13 PSG: AHi 27, RDI 30, desat to 48%, no plms 12/13 titration titrated to 12, had some residual events on it, no PLMS     7/14 spirometry ratio 44, FEV1 1.507 (40%), FVC 3.463 (71%).    4/15 compliance.  AHI  0.3, 99%, 6 hours and 44 minutes  7/15 compliance.  AHI 0.1, 7 hours and 27 minutes, 100% usage, no leaks  7/15 spiro ratio 68, FEV1 1.41, 44%, FVC 3, 64%  1/16 Compliance ahi 0.1 on 6 hrs and 38 mins, 100% usage      ASSESSMENT  10/14 patient seen for follow-up.  Patient is doing well since last time.  His breathing is stable at this time.  We will continue the same inhalers.  He is doing well with his CPAP machine.  I will give him an envelope to mail back his SD card and we will check his AHI data.  Follow-up in 6 months.  Patient does not want the flu shot or pneumonia shot  02/19/2014 Patient seen for follow up.   Doing well since his last visit.  Breathing has been stable.  He doesn't want to do a spirometry yet.  We will repeat on his follow-up.  Sleep has been well.  We will continue with CPAP at nighttime.  AHI 0.3  Follow up in 3 months.  He doesn't want any immunizations for pneumonia.      05/28/2014 Patient seen for follow up.   Patient's been doing well since last time.  His breathing is stable.  Spirometry done in the office today shows his FEV1 is stable at 44%.  We will continue the same inhalers.  He does not want a pneumonia shot.  Sleep apnea is well controlled with an AHI less than 1.  We will continue to same settings.  I will see him in follow-up in 6 months        ICD-10-CM ICD-9-CM    1. COPD (chronic obstructive pulmonary disease) ([place]) J44.9 496    2. Sleep apnea G47.30 780.57            PLAN      11/28/2014 Patient seen for follow up.   Copd  Doing well, cont the same inhalers. Renew rescue inhaler  OSA  Controlled on current setting.  Cont the same  F/u in 6 mths        Orders Placed This Encounter    ??? albuterol (PROVENTIL HFA, VENTOLIN HFA, PROAIR HFA) 90 mcg/actuation inhaler         There is no immunization history on file for this patient.  Does not want pneumonia shot or flu shot      Follow-up Disposition:  Return in about 6 months (around 05/29/2015).  Mora Appl, MD

## 2015-01-14 ENCOUNTER — Telehealth

## 2015-01-14 NOTE — Telephone Encounter (Signed)
Patient called in stating has not used his CPAP in months and would like it D/C?  Ok to [employer] or have him come in follow up?

## 2015-01-14 NOTE — Telephone Encounter (Signed)
Have him come for follow up

## 2015-01-16 NOTE — Telephone Encounter (Signed)
Patient called back and clarified he wants the 02 removed not the CPAP.  He has not used his 02 in months.  OK to d/c

## 2015-01-16 NOTE — Telephone Encounter (Signed)
Left message to schedule

## 2015-01-16 NOTE — Telephone Encounter (Signed)
Let's do an overnight oximetry on cpap w/o O2 and see how numbers look before we stop his O2

## 2015-01-17 NOTE — Telephone Encounter (Signed)
RX faxed to [facility].  Patient informed.

## 2015-02-18 NOTE — Telephone Encounter (Signed)
Yes.  Have him use the O2 again for his cpap.

## 2015-02-18 NOTE — Telephone Encounter (Signed)
Results of overnight pulse ox received.  Patient was at 4088 or below for more than 1 hour.  Should I call him to advise he still needs nocturnal 02?

## 2015-02-19 NOTE — Telephone Encounter (Signed)
Patient informed and will start to use again

## 2015-05-29 ENCOUNTER — Encounter: Attending: Internal Medicine | Primary: Family Medicine

## 2015-06-12 ENCOUNTER — Encounter: Attending: Internal Medicine | Primary: Family Medicine

## 2015-07-16 ENCOUNTER — Ambulatory Visit: Admit: 2015-07-16 | Discharge: 2015-07-16 | Payer: MEDICARE | Attending: Internal Medicine | Primary: Family Medicine

## 2015-07-16 ENCOUNTER — Telehealth

## 2015-07-16 DIAGNOSIS — J449 Chronic obstructive pulmonary disease, unspecified: Secondary | ICD-10-CM

## 2015-07-16 NOTE — Telephone Encounter (Signed)
RX faxed to [facility]

## 2015-07-16 NOTE — Telephone Encounter (Signed)
Please arrange for an overnight oximetry to be done off CPAP.  Diagnosis COPD

## 2015-07-16 NOTE — Patient Instructions (Signed)
Eating Healthy Foods: Care Instructions  Your Care Instructions  Eating healthy foods can help lower your risk for disease. Healthy food gives you energy and keeps your heart strong, your brain active, your muscles working, and your bones strong.  A healthy diet includes a variety of foods from the basic food groups: grains, vegetables, fruits, milk and milk products, and meat and beans. Some people may eat more of their favorite foods from only one food group and, as a result, miss getting the nutrients they need. So, it is important to pay attention not only to what you eat but also to what you are missing from your diet. You can eat a healthy, balanced diet by making a few small changes.  Follow-up care is a key part of your treatment and safety. Be sure to make and go to all appointments, and call your doctor if you are having problems. It???s also a good idea to know your test results and keep a list of the medicines you take.  How can you care for yourself at home?  Look at what you eat  ?? Keep a food diary for a week or two and record everything you eat or drink. Track the number of servings you eat from each food group.  ?? For a balanced diet every day, eat a variety of:  ?? 6 or more ounce-equivalents of grains, such as cereals, breads, crackers, rice, or pasta, every day. An ounce-equivalent is 1 slice of bread, 1 cup of ready-to-eat cereal, or ?? cup of cooked rice, cooked pasta, or cooked cereal.  ?? 2?? cups of vegetables, especially:  ?? Dark-[place] vegetables such as broccoli and spinach.  ?? Orange vegetables such as carrots and sweet potatoes.  ?? Dry beans (such as pinto and kidney beans) and peas (such as lentils).  ?? 2 cups of fresh, frozen, or canned fruit. A small apple or 1 banana or orange equals 1 cup.  ?? 3 cups of nonfat or low-fat milk, yogurt, or other milk products.  ?? 5?? ounces of meat and beans, such as chicken, fish, lean meat, beans,  nuts, and seeds. One egg, 1 tablespoon of peanut butter, ?? ounce nuts or seeds, or ?? cup of cooked beans equals 1 ounce of meat.  ?? Learn how to read food labels for serving sizes and ingredients. Fast-food and convenience-food meals often contain few or no fruits or vegetables. Make sure you eat some fruits and vegetables to make the meal more nutritious.  ?? Look at your food diary. For each food group, add up what you have eaten and then divide the total by the number of days. This will give you an idea of how much you are eating from each food group. See if you can find some ways to change your diet to make it more healthy.  Start small  ?? Do not try to make dramatic changes to your diet all at once. You might feel that you are missing out on your favorite foods and then be more likely to fail.  ?? Start slowly, and gradually change your habits. Try some of the following:  ?? Use whole wheat bread instead of white bread.  ?? Use nonfat or low-fat milk instead of whole milk.  ?? Eat brown rice instead of white rice, and eat whole wheat pasta instead of white-flour pasta.  ?? Try low-fat cheeses and low-fat yogurt.  ?? Add more fruits and vegetables to meals and have them for snacks.  ??   Add lettuce, tomato, cucumber, and onion to sandwiches.  ?? Add fruit to yogurt and cereal.  Enjoy food  ?? You can still eat your favorite foods. You just may need to eat less of them. If your favorite foods are high in fat, salt, and sugar, limit how often you eat them, but do not cut them out entirely.  ?? Eat a wide variety of foods.  Make healthy choices when eating out  ?? The type of restaurant you choose can help you make healthy choices. Even fast-food chains are now offering more low-fat or healthier choices on the menu.  ?? Choose smaller portions, or take half of your meal home.  ?? When eating out, try:  ?? A veggie pizza with a whole wheat crust or grilled chicken (instead of sausage or pepperoni).   ?? Pasta with roasted vegetables, grilled chicken, or marinara sauce instead of cream sauce.  ?? A vegetable wrap or grilled chicken wrap.  ?? Broiled or poached food instead of fried or breaded items.  Make healthy choices easy  ?? Buy packaged, prewashed, ready-to-eat fresh vegetables and fruits, such as baby carrots, salad mixes, and chopped or shredded broccoli and cauliflower.  ?? Buy packaged, presliced fruits, such as melon or pineapple.  ?? Choose 100% fruit or vegetable juice instead of soda. Limit juice intake to 4 to 6 oz (?? to ?? cup) a day.  ?? Blend low-fat yogurt, fruit juice, and canned or frozen fruit to make a smoothie for breakfast or a snack.  Where can you learn more?  Go to http://www.healthwise.net/[facility]  Enter T756 in the search box to learn more about "Eating Healthy Foods: Care Instructions."  ?? 2006-2016 Healthwise, Incorporated. Care instructions adapted under license by [employer] (which disclaims liability or warranty for this information). This care instruction is for use with your licensed healthcare professional. If you have questions about a medical condition or this instruction, always ask your healthcare professional. Healthwise, Incorporated disclaims any warranty or liability for your use of this information.  Content Version: 10.9.538570; Current as of: September 27, 2014

## 2015-07-16 NOTE — Progress Notes (Signed)
PULMONARY/SLEEP MEDICINE    07/21/2015     CHIEF COMPLAINT: had concerns including COPD.    HISTORY OF PRESENT ILLNESS: Donald Charles presents for had concerns including COPD.          Has lost a lot of weight  PreDM is gone  Not on water pills anymore  Saw Dr. Pollyann Samples last wk and has yearly follow up    Breathing has been well.  No cough, phlegm, wheezing, chest pain.  No fevers, chills.  No sinus issues.  No heartburn.    Not using o2 at night and possibly can be d/c'ed.  Sleeping well with his cpap machine  Sleeps well w/o i as well  Not using resc inhalers    Goes to bed now at 11-2 am and offset at tst of 6-7 hrs later  Takes a nap in the day b/c he likes to  No trouble driving            Past Medical History   Diagnosis Date   ??? BPH (benign prostatic hyperplasia)    ??? COPD (chronic obstructive pulmonary disease) ([place])    ??? Hyperlipidemia    ??? Hypertension    ??? OSA on CPAP      Past Surgical History   Procedure Laterality Date   ??? 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 irbesartan (AVAPRO) 75 mg tablet Take 75 mg by mouth nightly.     ??? albuterol (PROVENTIL HFA, VENTOLIN HFA, PROAIR HFA) 90 mcg/actuation inhaler Take 2 Puffs by inhalation every four (4) hours as needed. Pt. Wants ProAir HFA 3 Inhaler 3   ??? fluticasone-salmeterol (ADVAIR DISKUS) 250-50 mcg/dose diskus inhaler Take 1 Puff by inhalation every twelve (12) hours. 3 Inhaler 3   ??? cpap machine kit by Does Not Apply route. At 12     ??? OXYGEN-AIR DELIVERY SYSTEMS (KEYSTONE + OXYGEN CONCENTRATOR) by Does Not Apply route. At 2L/min with exertion and at night     ??? metoprolol succinate (TOPROL-XL) 100 mg XL tablet Take  by mouth daily.       I attest that current medications are reviewed and are accurate.    No Known Allergies  Social History     Social History   ??? Marital status: DIVORCED     Spouse name: N/A   ??? Number of children: N/A   ??? Years of education: N/A      Social History Main Topics   ??? Smoking status: Former Smoker     Packs/day: 1.00     Years: 30.00   ??? Smokeless tobacco: Never Used      Comment: quit 15 yrs ago   ??? Alcohol use Yes      Comment: occasional   ??? Drug use: No   ??? Sexual activity: Not Asked     Other Topics Concern   ??? None     Social History Narrative     Family History   Problem Relation Age of Onset   ??? Cancer Mother      lung        REVIEW OF SYSTEMS:    dyspnea.   hemoptysis.   cough.   orthopnea.   wheeze.   sputum production.  fever, sweats, or chills.   unusual fatigue.   loss of appetite.   weight loss more than 5 lbs.   headaches.  ear aches.   eye irritation.   blurred or double vision.   nose or sinus problems, including hay fever.   dry eyes or dry mouth.   snoring.   breast discomfort.   chest pain.   irregular or rapid heart beats.   heartburn or indigestion.   difficulty swallowing or regurgitation.   nausea or vomiting.   abdominl pain.   diarrhea.   constipation.   difficult or painful urination.   frequent urination.   swelling at the ankles.   joint pains or muscle aches.   fingers turn white and painful in the cold.   back pain or neck pain.   automobile accident or other [place] injury.   unusual dizziness, faintness or loss of consciousness.   numbness or weakness of part of your body.   anxiety.   depression.          PHYSICAL EXAM:   Visit Vitals   ??? BP 130/72   ??? Pulse 66   ??? Ht '5\' 11"'  (1.803 m)   ??? Wt 96.8 kg (213 lb 8 oz)   ??? SpO2 96%   ??? BMI 29.78 kg/m2      General Appearance: NAD, pleasant, well built and nourished  HEENT: no thrush, no mucositis, TM's normal, turbinates normal, oropharynx normal  Oral Cavity: normal teeth, no lesions  Neck, Thyroid: supple, no lymphadenopathy, trachea at midline, no thyromegaly, JVP flat  Heart: regular rate and rhythm, S1, S2 without murmur.    Lungs: clear to auscultation, good air entry bilaterally.    Chest: normal shape and expansion, no use of accessory muscles, clear to  percussion, normal fremitus  Abdomen: soft, NT/ND, BS present, no masses palpated, no hepatosplenomegaly  Extremities: no cyanosis, no clubbing, no edema.    Peripheral pulses: normal (2+) bilaterally  Neurologic: no focal signs, awake and alert, oriented x 3, normal cranial nerves II-XII sensory and motor wnl, DTR 2 plus, gait normal, normal sensation and strength  Lymph nodes not palpable  Skin: warm, dry, normal, no rash  Back: no midline or CVA tenderness  Lymphatics: lymphoedema absent      LABS AND TESTING:   Results for orders placed or performed during the hospital encounter of 08/26/14   PTT   Result Value Ref Range    aPTT 23.7 22.0 - 29.0 SEC   CBC W/O DIFF   Result Value Ref Range    WBC 10.9 (H) 4.8 - 10.6 K/uL    RBC 4.51 (L) 4.70 - 6.00 M/uL    HGB 13.6 (L) 14.0 - 18.0 g/dL    HCT 42.9 42.0 - 52.0 %    MCV 95.1 (H) 81.0 - 94.0 FL    MCH 30.2 27.0 - 35.0 PG    MCHC 31.7 30.7 - 37.3 g/dL    RDW 14.5 (H) 11.5 - 14.0 %    PLATELET 217 130 - 400 K/uL    MPV 11.2 9.2 - 08.6 FL   METABOLIC PANEL, BASIC   Result Value Ref Range    Sodium 139 136 - 145 mmol/L    Potassium 4.4 3.5 - 5.1 mmol/L    Chloride 101 98 - 107 mmol/L    CO2 31 21 - 32 mmol/L    Anion gap 12 10 - 20 mmol/L    Glucose 95 74 - 106 mg/dL    BUN 26 (H) 7 - 18 mg/dL    Creatinine 1.3 0.6 - 1.3 mg/dL    GFR est AA >60 >  60 ml/min/1.9m    GFR est non-AA 58 (L) >60 ml/min/1.741m   Calcium 8.8 8.5 - 10.1 mg/dL   EKG, 12 LEAD, INITIAL   Result Value Ref Range    Ventricular Rate 67 BPM    Atrial Rate 67 BPM    P-R Interval 188 ms    QRS Duration 136 ms    Q-T Interval 446 ms    QTC Calculation (Bezet) 471 ms    Calculated P Axis 65 degrees    Calculated R Axis 87 degrees    Calculated T Axis 63 degrees    Diagnosis       Sinus rhythm with premature supraventricular complexes  Right bundle branch block  Abnormal ECG  No previous ECGs available  Confirmed by Najovits MD, AnMitzi [id number]) on 08/26/2014 4:42:47 PM         9/13 CXR: enlarged cor, basilar atx 9/13 Spiro ratio 51, FEV1 1.1359 (36%), FVC 2.682 (55%) 9/13 MRI of abd: mult hepatic and renal cysts -benign, mild adrenal hyperplasia with no mass9/13 CT Chest: copd changes with apical blebs, possible PH, cad, 5.7 left renal mass -possibly a cyst, 1.4 cm liver mass-possibly cyst10/12/2011 9:14:23 AM > , PFTs ratio 43, FEV1 1.39 (43%), TLC 9.77 (139%), DLCO 18.7 (61%), DLCO/VA 3.88 (106%) 10/13 PSG: AHi 27, RDI 30, desat to 48%, no plms 12/13 titration titrated to 12, had some residual events on it, no PLMS     7/14 spirometry ratio 44, FEV1 1.507 (40%), FVC 3.463 (71%).    4/15 compliance.  AHI  0.3, 99%, 6 hours and 44 minutes  7/15 compliance.  AHI 0.1, 7 hours and 27 minutes, 100% usage, no leaks  7/15 spiro ratio 68, FEV1 1.41, 44%, FVC 3, 64%  1/16 Compliance ahi 0.1 on 6 hrs and 38 mins, 100% usage      ASSESSMENT  10/14 patient seen for follow-up.  Patient is doing well since last time.  His breathing is stable at this time.  We will continue the same inhalers.  He is doing well with his CPAP machine.  I will give him an envelope to mail back his SD card and we will check his AHI data.  Follow-up in 6 months.  Patient does not want the flu shot or pneumonia shot  02/19/2014 Patient seen for follow up.   Doing well since his last visit.  Breathing has been stable.  He doesn't want to do a spirometry yet.  We will repeat on his follow-up.  Sleep has been well.  We will continue with CPAP at nighttime.  AHI 0.3  Follow up in 3 months.  He doesn't want any immunizations for pneumonia.      05/28/2014 Patient seen for follow up.   Patient's been doing well since last time.  His breathing is stable.  Spirometry done in the office today shows his FEV1 is stable at 44%.  We will continue the same inhalers.  He does not want a pneumonia shot.  Sleep apnea is well controlled with an AHI less than 1.  We will continue to same settings.  I will see him in follow-up in 6 months       11/28/2014 Patient seen for follow up.   Copd  Doing well, cont the same inhalers. Renew rescue inhaler  OSA  Controlled on current setting.  Cont the same  F/u in 6 mths          ICD-10-CM ICD-9-CM    1. Chronic obstructive  pulmonary disease, unspecified COPD type ([place]) J44.9 496 AMB POC SPIROMETRY      RT--OVERNIGHT OXIMETRY   2. Obstructive sleep apnea syndrome G47.33 327.23            PLAN    07/16/2015 Patient seen for follow up.   Overnight oximetry off CPAP  Lung cancer screening.  Same inhalers.  ?  Discontinue oxygen        Orders Placed This Encounter   ??? AMB POC SPIROMETRY   ??? RT--OVERNIGHT OXIMETRY         There is no immunization history on file for this patient.  Does not want pneumonia shot or flu shot        Monitored Studies    Arlyce Harman 7/15 spiro ratio 68, FEV1 1.41, 44%, FVC 3, 64%    9/16 spirometry ratio of 52, FEV1 2.13, 62%, FVC 4.05, 87%     PFTs    CXR    CT chest    PSG/Titration    Compliance 1/16 Compliance ahi 0.1 on 6 hrs and 38 mins, 100% usage  9/16 compliance, 78%, 5 hours and 26 minutes, AHI 0.4 on CPAP 12   Other:    Other:    Other:    Other:    Other:    Other:             Follow-up Disposition:  Return in about 6 months (around 01/13/2016).  Mora Appl, MD

## 2015-07-29 NOTE — Telephone Encounter (Signed)
RX refaxed to [facility]

## 2015-07-29 NOTE — Telephone Encounter (Signed)
Pt called and was told to call if he did not hear from someone about his overnight oximetry. Pt is aware that the Dr is not in the office.

## 2015-07-29 NOTE — Telephone Encounter (Signed)
To heather pls advise

## 2015-08-05 NOTE — Telephone Encounter (Signed)
Spoke to Tammy at [facility] who did speak to patient and have arranged for delivery of the equipment tomorrow with patient

## 2015-08-15 ENCOUNTER — Telehealth

## 2015-08-15 MED ORDER — ADVAIR DISKUS 250 MCG-50 MCG/DOSE POWDER FOR INHALATION
250-50 mcg/dose | RESPIRATORY_TRACT | 3 refills | Status: DC
Start: 2015-08-15 — End: 2016-08-09

## 2015-08-15 NOTE — Telephone Encounter (Signed)
Results of overnight pulse ox received and scanned to chart.   Patient did qualify and is already on service with [facility] for nocturnal 02.  Since he requalified I can send renewal RX

## 2015-08-15 NOTE — Telephone Encounter (Signed)
Please tell him to continue his O2 at night

## 2015-08-15 NOTE — Telephone Encounter (Signed)
Patient informed and stated that he does not want to use it and has not used it in 6 months.  I advised him based on the results he was at 89 or below for >3 hours and Dr. Virl Son is recommending he continue and he said he would.  Continuation RX faxed to [facility]

## 2016-01-13 ENCOUNTER — Encounter: Attending: Internal Medicine | Primary: Family Medicine

## 2016-01-20 ENCOUNTER — Encounter: Attending: Internal Medicine | Primary: Family Medicine

## 2016-02-09 ENCOUNTER — Telehealth

## 2016-02-09 ENCOUNTER — Ambulatory Visit: Admit: 2016-02-09 | Discharge: 2016-02-09 | Payer: MEDICARE | Attending: Internal Medicine | Primary: Family Medicine

## 2016-02-09 DIAGNOSIS — J449 Chronic obstructive pulmonary disease, unspecified: Secondary | ICD-10-CM

## 2016-02-09 NOTE — Telephone Encounter (Signed)
Please arrange for an overnight oximetry to be done off CPAP.  Dx: COPD

## 2016-02-09 NOTE — Progress Notes (Signed)
PULMONARY/SLEEP MEDICINE    02/11/2016     CHIEF COMPLAINT: had concerns including COPD.    HISTORY OF PRESENT ILLNESS: Donald Charles presents for had concerns including COPD.        Pulmonary/Sleep Meds Advair, alb, oxygen conc   Failed Meds        Lost 140 lbs with the gastric sleeve    Breathing has been well.  No cough, phlegm, wheezing, chest pain.  No fevers, chills.  No sinus issues.  No heartburn.    Has been off cpap since the weight loss        Past Medical History:   Diagnosis Date   ??? BPH (benign prostatic hyperplasia)    ??? COPD (chronic obstructive pulmonary disease) ([place])    ??? Hyperlipidemia    ??? Hypertension    ??? OSA on CPAP      Past Surgical History:   Procedure Laterality Date   ??? 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 ADVAIR DISKUS 250-50 mcg/dose diskus inhaler USE 1 INHALATION EVERY 12 HOURS. 3 Inhaler 3   ??? irbesartan (AVAPRO) 75 mg tablet Take 75 mg by mouth nightly.     ??? albuterol (PROVENTIL HFA, VENTOLIN HFA, PROAIR HFA) 90 mcg/actuation inhaler Take 2 Puffs by inhalation every four (4) hours as needed. Pt. Wants ProAir HFA 3 Inhaler 3   ??? cpap machine kit by Does Not Apply route. At 12     ??? OXYGEN-AIR DELIVERY SYSTEMS (KEYSTONE + OXYGEN CONCENTRATOR) by Does Not Apply route. At 2L/min with exertion and at night     ??? metoprolol succinate (TOPROL-XL) 100 mg XL tablet Take  by mouth daily.       I attest that current medications are reviewed and are accurate.    No Known Allergies  Social History     Social History   ??? Marital status: DIVORCED     Spouse name: N/A   ??? Number of children: N/A   ??? Years of education: N/A     Social History Main Topics   ??? Smoking status: Former Smoker     Packs/day: 1.00     Years: 30.00   ??? Smokeless tobacco: Never Used      Comment: quit 15 yrs ago   ??? Alcohol use Yes      Comment: occasional   ??? Drug use: No   ??? Sexual activity: Not Asked     Other Topics Concern   ??? None      Social History Narrative     Family History   Problem Relation Age of Onset   ??? Cancer Mother      lung        REVIEW OF SYSTEMS:    dyspnea.   hemoptysis.   cough.   orthopnea.   wheeze.   sputum production.  fever, sweats, or chills.   unusual fatigue.   loss of appetite.   weight loss more than 5 lbs.   headaches.   ear aches.   eye irritation.   blurred or double vision.   nose or sinus problems, including hay fever.   dry eyes or dry mouth.   snoring.   breast discomfort.   chest pain.   irregular or rapid heart beats.   heartburn or indigestion.   difficulty swallowing or regurgitation.   nausea or vomiting.   abdominl pain.  diarrhea.   constipation.   difficult or painful urination.   frequent urination.   swelling at the ankles.   joint pains or muscle aches.   fingers turn white and painful in the cold.   back pain or neck pain.   automobile accident or other [place] injury.   unusual dizziness, faintness or loss of consciousness.   numbness or weakness of part of your body.   anxiety.   depression.          PHYSICAL EXAM:   Visit Vitals   ??? BP 140/62   ??? Pulse 82   ??? Ht 5' 11" (1.803 m)   ??? Wt 102.5 kg (225 lb 14.4 oz)   ??? SpO2 96%   ??? BMI 31.51 kg/m2      General Appearance: NAD, pleasant, well built and nourished  HEENT: no thrush, no mucositis, TM's normal, turbinates normal, oropharynx normal  Oral Cavity: normal teeth, no lesions  Neck, Thyroid: supple, no lymphadenopathy, trachea at midline, no thyromegaly, JVP flat  Heart: regular rate and rhythm, S1, S2 without murmur.    Lungs: clear to auscultation, good air entry bilaterally.    Chest: normal shape and expansion, no use of accessory muscles, clear to percussion, normal fremitus  Abdomen: soft, NT/ND, BS present, no masses palpated, no hepatosplenomegaly  Extremities: no cyanosis, no clubbing, no edema.    Peripheral pulses: normal (2+) bilaterally  Neurologic: no focal signs, awake and alert, oriented x 3, normal cranial  nerves II-XII sensory and motor wnl, DTR 2 plus, gait normal, normal sensation and strength  Lymph nodes not palpable  Skin: warm, dry, normal, no rash  Back: no midline or CVA tenderness  Lymphatics: lymphoedema absent      LABS AND TESTING:   Results for orders placed or performed during the hospital encounter of 08/26/14   PTT   Result Value Ref Range    aPTT 23.7 22.0 - 29.0 SEC   CBC W/O DIFF   Result Value Ref Range    WBC 10.9 (H) 4.8 - 10.6 K/uL    RBC 4.51 (L) 4.70 - 6.00 M/uL    HGB 13.6 (L) 14.0 - 18.0 g/dL    HCT 42.9 42.0 - 52.0 %    MCV 95.1 (H) 81.0 - 94.0 FL    MCH 30.2 27.0 - 35.0 PG    MCHC 31.7 30.7 - 37.3 g/dL    RDW 14.5 (H) 11.5 - 14.0 %    PLATELET 217 130 - 400 K/uL    MPV 11.2 9.2 - 14.7 FL   METABOLIC PANEL, BASIC   Result Value Ref Range    Sodium 139 136 - 145 mmol/L    Potassium 4.4 3.5 - 5.1 mmol/L    Chloride 101 98 - 107 mmol/L    CO2 31 21 - 32 mmol/L    Anion gap 12 10 - 20 mmol/L    Glucose 95 74 - 106 mg/dL    BUN 26 (H) 7 - 18 mg/dL    Creatinine 1.3 0.6 - 1.3 mg/dL    GFR est AA >60 >60 ml/min/1.61m    GFR est non-AA 58 (L) >60 ml/min/1.755m   Calcium 8.8 8.5 - 10.1 mg/dL   EKG, 12 LEAD, INITIAL   Result Value Ref Range    Ventricular Rate 67 BPM    Atrial Rate 67 BPM    P-R Interval 188 ms    QRS Duration 136 ms    Q-T Interval 446 ms  QTC Calculation (Bezet) 471 ms    Calculated P Axis 65 degrees    Calculated R Axis 87 degrees    Calculated T Axis 63 degrees    Diagnosis       Sinus rhythm with premature supraventricular complexes  Right bundle branch block  Abnormal ECG  No previous ECGs available  Confirmed by Najovits MD, Mitzi Hansen ([id number]) on 08/26/2014 4:42:47 PM        9/13 CXR: enlarged cor, basilar atx 9/13 Spiro ratio 51, FEV1 1.1359 (36%), FVC 2.682 (55%) 9/13 MRI of abd: mult hepatic and renal cysts -benign, mild adrenal hyperplasia with no mass9/13 CT Chest: copd changes with apical blebs, possible PH, cad, 5.7 left renal mass -possibly a cyst,  1.4 cm liver mass-possibly cyst10/12/2011 9:14:23 AM > , PFTs ratio 43, FEV1 1.39 (43%), TLC 9.77 (139%), DLCO 18.7 (61%), DLCO/VA 3.88 (106%) 10/13 PSG: AHi 27, RDI 30, desat to 48%, no plms 12/13 titration titrated to 12, had some residual events on it, no PLMS     7/14 spirometry ratio 44, FEV1 1.507 (40%), FVC 3.463 (71%).    4/15 compliance.  AHI  0.3, 99%, 6 hours and 44 minutes  7/15 compliance.  AHI 0.1, 7 hours and 27 minutes, 100% usage, no leaks  7/15 spiro ratio 68, FEV1 1.41, 44%, FVC 3, 64%  1/16 Compliance ahi 0.1 on 6 hrs and 38 mins, 100% usage      ASSESSMENT  10/14 patient seen for follow-up.  Patient is doing well since last time.  His breathing is stable at this time.  We will continue the same inhalers.  He is doing well with his CPAP machine.  I will give him an envelope to mail back his SD card and we will check his AHI data.  Follow-up in 6 months.  Patient does not want the flu shot or pneumonia shot  02/19/2014 Patient seen for follow up.   Doing well since his last visit.  Breathing has been stable.  He doesn't want to do a spirometry yet.  We will repeat on his follow-up.  Sleep has been well.  We will continue with CPAP at nighttime.  AHI 0.3  Follow up in 3 months.  He doesn't want any immunizations for pneumonia.      05/28/2014 Patient seen for follow up.   Patient's been doing well since last time.  His breathing is stable.  Spirometry done in the office today shows his FEV1 is stable at 44%.  We will continue the same inhalers.  He does not want a pneumonia shot.  Sleep apnea is well controlled with an AHI less than 1.  We will continue to same settings.  I will see him in follow-up in 6 months      11/28/2014 Patient seen for follow up.   Copd  Doing well, cont the same inhalers. Renew rescue inhaler  OSA  Controlled on current setting.  Cont the same  F/u in 6 mths      07/16/2015 Patient seen for follow up.   Overnight oximetry off CPAP  Lung cancer screening.  Same inhalers.   ?  Discontinue oxygen          ICD-10-CM ICD-9-CM    1. Chronic obstructive pulmonary disease, unspecified COPD type ([place]) J44.9 496    2. Sleep apnea, obstructive G47.33 327.23            PLAN      02/09/2016 Patient seen for follow up.  Patient has been doing well since last time he was seen.  He continues to lose weight at this time.    COPD  Currently, stable on current medications.  We will repeat his spirometry on  follow-up.  Sleep apnea.  Currently off CPAP because of weight loss, but we will check his overnight oximetry and if it still low, we will put him back on CPAP.  His pressure may need to be given the fact the fact that he's lost a lot of weight.  Follow up in 6 months            No orders of the defined types were placed in this encounter.        There is no immunization history on file for this patient.  Does not want pneumonia shot or flu shot        Monitored Studies    Arlyce Harman 7/15 spiro ratio 68, FEV1 1.41, 44%, FVC 3, 64%    9/16 spirometry ratio of 52, FEV1 2.13, 62%, FVC 4.05, 87%     PFTs    CXR    CT chest    PSG/Titration    Compliance 1/16 Compliance ahi 0.1 on 6 hrs and 38 mins, 100% usage  9/16 compliance, 78%, 5 hours and 26 minutes, AHI 0.4 on CPAP 12   Other:    Other:    Other:    Other:    Other:    Other:             Follow-up Disposition:  Return in about 6 months (around 08/10/2016).  Mora Appl, MD

## 2016-02-09 NOTE — Patient Instructions (Signed)
Eating Healthy Foods: Care Instructions  Your Care Instructions  Eating healthy foods can help lower your risk for disease. Healthy food gives you energy and keeps your heart strong, your brain active, your muscles working, and your bones strong.  A healthy diet includes a variety of foods from the basic food groups: grains, vegetables, fruits, milk and milk products, and meat and beans. Some people may eat more of their favorite foods from only one food group and, as a result, miss getting the nutrients they need. So, it is important to pay attention not only to what you eat but also to what you are missing from your diet. You can eat a healthy, balanced diet by making a few small changes.  Follow-up care is a key part of your treatment and safety. Be sure to make and go to all appointments, and call your doctor if you are having problems. It???s also a good idea to know your test results and keep a list of the medicines you take.  How can you care for yourself at home?  Look at what you eat  ?? Keep a food diary for a week or two and record everything you eat or drink. Track the number of servings you eat from each food group.  ?? For a balanced diet every day, eat a variety of:  ?? 6 or more ounce-equivalents of grains, such as cereals, breads, crackers, rice, or pasta, every day. An ounce-equivalent is 1 slice of bread, 1 cup of ready-to-eat cereal, or ?? cup of cooked rice, cooked pasta, or cooked cereal.  ?? 2?? cups of vegetables, especially:  ?? Dark-[place] vegetables such as broccoli and spinach.  ?? Orange vegetables such as carrots and sweet potatoes.  ?? Dry beans (such as pinto and kidney beans) and peas (such as lentils).  ?? 2 cups of fresh, frozen, or canned fruit. A small apple or 1 banana or orange equals 1 cup.  ?? 3 cups of nonfat or low-fat milk, yogurt, or other milk products.  ?? 5?? ounces of meat and beans, such as chicken, fish, lean meat, beans,  nuts, and seeds. One egg, 1 tablespoon of peanut butter, ?? ounce nuts or seeds, or ?? cup of cooked beans equals 1 ounce of meat.  ?? Learn how to read food labels for serving sizes and ingredients. Fast-food and convenience-food meals often contain few or no fruits or vegetables. Make sure you eat some fruits and vegetables to make the meal more nutritious.  ?? Look at your food diary. For each food group, add up what you have eaten and then divide the total by the number of days. This will give you an idea of how much you are eating from each food group. See if you can find some ways to change your diet to make it more healthy.  Start small  ?? Do not try to make dramatic changes to your diet all at once. You might feel that you are missing out on your favorite foods and then be more likely to fail.  ?? Start slowly, and gradually change your habits. Try some of the following:  ?? Use whole wheat bread instead of white bread.  ?? Use nonfat or low-fat milk instead of whole milk.  ?? Eat brown rice instead of white rice, and eat whole wheat pasta instead of white-flour pasta.  ?? Try low-fat cheeses and low-fat yogurt.  ?? Add more fruits and vegetables to meals and have them for snacks.  ??   Add lettuce, tomato, cucumber, and onion to sandwiches.  ?? Add fruit to yogurt and cereal.  Enjoy food  ?? You can still eat your favorite foods. You just may need to eat less of them. If your favorite foods are high in fat, salt, and sugar, limit how often you eat them, but do not cut them out entirely.  ?? Eat a wide variety of foods.  Make healthy choices when eating out  ?? The type of restaurant you choose can help you make healthy choices. Even fast-food chains are now offering more low-fat or healthier choices on the menu.  ?? Choose smaller portions, or take half of your meal home.  ?? When eating out, try:  ?? A veggie pizza with a whole wheat crust or grilled chicken (instead of sausage or pepperoni).   ?? Pasta with roasted vegetables, grilled chicken, or marinara sauce instead of cream sauce.  ?? A vegetable wrap or grilled chicken wrap.  ?? Broiled or poached food instead of fried or breaded items.  Make healthy choices easy  ?? Buy packaged, prewashed, ready-to-eat fresh vegetables and fruits, such as baby carrots, salad mixes, and chopped or shredded broccoli and cauliflower.  ?? Buy packaged, presliced fruits, such as melon or pineapple.  ?? Choose 100% fruit or vegetable juice instead of soda. Limit juice intake to 4 to 6 oz (?? to ?? cup) a day.  ?? Blend low-fat yogurt, fruit juice, and canned or frozen fruit to make a smoothie for breakfast or a snack.  Where can you learn more?  Go to http://www.healthwise.net/GoodHelpConnections.  Enter T756 in the search box to learn more about "Eating Healthy Foods: Care Instructions."  Current as of: September 27, 2014  Content Version: 11.2  ?? 2006-2017 [employer], Inc[employer]. Care instructions adapted under license by [employer] (which disclaims liability or warranty for this information). If you have questions about a medical condition or this instruction, always ask your healthcare professional. Healthwise, Incorporated disclaims any warranty or liability for your use of this information.

## 2016-02-11 NOTE — Telephone Encounter (Signed)
RX faxed to [facility]

## 2016-02-24 NOTE — Telephone Encounter (Signed)
Results of overnight pulse ox received and scanned to chart.  Patient is already on service with [facility] for nocturnal 02 and based on results still needs it

## 2016-03-02 NOTE — Telephone Encounter (Signed)
Patient informed he is to continue nocturnal 02

## 2016-03-18 ENCOUNTER — Telehealth

## 2016-03-18 NOTE — Telephone Encounter (Signed)
RX's faxed to [facility].  Patient informed.

## 2016-03-18 NOTE — Telephone Encounter (Signed)
Ok to get him heated tubing.  Dec pressure to 10.

## 2016-03-18 NOTE — Telephone Encounter (Signed)
Patient called in advising that the CPAP is making him so dried out even though his humidifier is set to max setting.  Should I order him heated tubing, he does not have?  Also he mentioned that at last visit it was discussed that his pressure was possible going to change given he had recently lost a lot of weight and he wants to know if you still want to change his pressure?  Its currently set to 12 and he states it feels good and he feels good, he does not feel it is to much, is able to sleep well through the night and is okay during the day, do you want to change pressure?

## 2016-08-09 MED ORDER — ADVAIR DISKUS 250 MCG-50 MCG/DOSE POWDER FOR INHALATION
250-50 mcg/dose | RESPIRATORY_TRACT | 3 refills | Status: DC
Start: 2016-08-09 — End: 2017-08-02

## 2016-08-10 ENCOUNTER — Encounter: Attending: Internal Medicine | Primary: Family Medicine

## 2016-08-23 ENCOUNTER — Telehealth

## 2016-08-23 ENCOUNTER — Ambulatory Visit: Admit: 2016-08-23 | Discharge: 2016-08-23 | Payer: MEDICARE | Attending: Internal Medicine | Primary: Family Medicine

## 2016-08-23 DIAGNOSIS — J449 Chronic obstructive pulmonary disease, unspecified: Secondary | ICD-10-CM

## 2016-08-23 MED ORDER — ALBUTEROL SULFATE HFA 90 MCG/ACTUATION AEROSOL INHALER
90 mcg/actuation | RESPIRATORY_TRACT | 3 refills | Status: DC | PRN
Start: 2016-08-23 — End: 2017-10-22

## 2016-08-23 NOTE — Progress Notes (Signed)
PULMONARY/SLEEP MEDICINE    08/23/2016     CHIEF COMPLAINT: had concerns including Sleep Apnea.    HISTORY OF PRESENT ILLNESS: Donald Charles presents for had concerns including Sleep Apnea.            Pulmonary/Sleep Meds Advair, alb, oxygen conc   Failed Meds        Breathing has been well.  No cough, phlegm, wheezing, chest pain.  No fevers, chills.  No sinus issues.  No heartburn.  Has not needed resc inhalers    Lowered the pressure on his cpap machine  Decreased the pressure to 8    Sleep has been   Goes to sleep at 12 am and wakes up at 7-8 am  Wakes up feeling    snoring with the machine  Daytime energy is    naps   trouble driving  Mask is   Pressure is    Replacements  Weight is     No trouble even if he doesn't use the machine  Wt is now levelled off          Past Medical History:   Diagnosis Date   ??? BPH (benign prostatic hyperplasia)    ??? COPD (chronic obstructive pulmonary disease) ([place])    ??? Hyperlipidemia    ??? Hypertension    ??? OSA on CPAP      Past Surgical History:   Procedure Laterality Date   ??? 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 albuterol (PROVENTIL HFA, VENTOLIN HFA, PROAIR HFA) 90 mcg/actuation inhaler Take 2 Puffs by inhalation every four (4) hours as needed. Pt. Wants ProAir HFA 3 Inhaler 3   ??? ADVAIR DISKUS 250-50 mcg/dose diskus inhaler USE 1 INHALATION EVERY 12 HOURS 180 Each 3   ??? irbesartan (AVAPRO) 75 mg tablet Take 75 mg by mouth nightly.     ??? cpap machine kit by Does Not Apply route. At 12     ??? OXYGEN-AIR DELIVERY SYSTEMS (KEYSTONE + OXYGEN CONCENTRATOR) by Does Not Apply route. At 2L/min with exertion and at night     ??? metoprolol succinate (TOPROL-XL) 100 mg XL tablet Take  by mouth daily.       I attest that current medications are reviewed and are accurate.    No Known Allergies  Social History     Social History   ??? Marital status: DIVORCED     Spouse name: N/A   ??? Number of children: N/A    ??? Years of education: N/A     Social History Main Topics   ??? Smoking status: Former Smoker     Packs/day: 1.00     Years: 30.00   ??? Smokeless tobacco: Never Used      Comment: quit 15 yrs ago   ??? Alcohol use Yes      Comment: occasional   ??? Drug use: No   ??? Sexual activity: Not Asked     Other Topics Concern   ??? None     Social History Narrative     Family History   Problem Relation Age of Onset   ??? Cancer Mother      lung        REVIEW OF SYSTEMS:    dyspnea.   hemoptysis.   cough.   orthopnea.   wheeze.   sputum production.  fever, sweats, or chills.   unusual fatigue.  loss of appetite.   weight loss more than 5 lbs.   headaches.   ear aches.   eye irritation.   blurred or double vision.   nose or sinus problems, including hay fever.   dry eyes or dry mouth.   snoring.   breast discomfort.   chest pain.   irregular or rapid heart beats.   heartburn or indigestion.   difficulty swallowing or regurgitation.   nausea or vomiting.   abdominl pain.   diarrhea.   constipation.   difficult or painful urination.   frequent urination.   swelling at the ankles.   joint pains or muscle aches.   fingers turn white and painful in the cold.   back pain or neck pain.   automobile accident or other [place] injury.   unusual dizziness, faintness or loss of consciousness.   numbness or weakness of part of your body.   anxiety.   depression.          PHYSICAL EXAM:   Visit Vitals   ??? BP 132/74   ??? Pulse 73   ??? Ht '5\' 11"'  (1.803 m)   ??? Wt 111.1 kg (245 lb)   ??? SpO2 93%   ??? BMI 34.17 kg/m2      General Appearance: NAD, pleasant, well built and nourished  HEENT: no thrush, no mucositis, TM's normal, turbinates normal, oropharynx normal  Oral Cavity: normal teeth, no lesions  Neck, Thyroid: supple, no lymphadenopathy, trachea at midline, no thyromegaly, JVP flat  Heart: regular rate and rhythm, S1, S2 without murmur.    Lungs: clear to auscultation, good air entry bilaterally.     Chest: normal shape and expansion, no use of accessory muscles, clear to percussion, normal fremitus  Abdomen: soft, NT/ND, BS present, no masses palpated, no hepatosplenomegaly  Extremities: no cyanosis, no clubbing, no edema.    Peripheral pulses: normal (2+) bilaterally  Neurologic: no focal signs, awake and alert, oriented x 3, normal cranial nerves II-XII sensory and motor wnl, DTR 2 plus, gait normal, normal sensation and strength  Lymph nodes not palpable  Skin: warm, dry, normal, no rash  Back: no midline or CVA tenderness  Lymphatics: lymphoedema absent      LABS AND TESTING:   Results for orders placed or performed during the hospital encounter of 08/26/14   PTT   Result Value Ref Range    aPTT 23.7 22.0 - 29.0 SEC   CBC W/O DIFF   Result Value Ref Range    WBC 10.9 (H) 4.8 - 10.6 K/uL    RBC 4.51 (L) 4.70 - 6.00 M/uL    HGB 13.6 (L) 14.0 - 18.0 g/dL    HCT 42.9 42.0 - 52.0 %    MCV 95.1 (H) 81.0 - 94.0 FL    MCH 30.2 27.0 - 35.0 PG    MCHC 31.7 30.7 - 37.3 g/dL    RDW 14.5 (H) 11.5 - 14.0 %    PLATELET 217 130 - 400 K/uL    MPV 11.2 9.2 - 97.9 FL   METABOLIC PANEL, BASIC   Result Value Ref Range    Sodium 139 136 - 145 mmol/L    Potassium 4.4 3.5 - 5.1 mmol/L    Chloride 101 98 - 107 mmol/L    CO2 31 21 - 32 mmol/L    Anion gap 12 10 - 20 mmol/L    Glucose 95 74 - 106 mg/dL    BUN 26 (H) 7 - 18 mg/dL  Creatinine 1.3 0.6 - 1.3 mg/dL    GFR est AA >60 >60 ml/min/1.75m    GFR est non-AA 58 (L) >60 ml/min/1.732m   Calcium 8.8 8.5 - 10.1 mg/dL   EKG, 12 LEAD, INITIAL   Result Value Ref Range    Ventricular Rate 67 BPM    Atrial Rate 67 BPM    P-R Interval 188 ms    QRS Duration 136 ms    Q-T Interval 446 ms    QTC Calculation (Bezet) 471 ms    Calculated P Axis 65 degrees    Calculated R Axis 87 degrees    Calculated T Axis 63 degrees    Diagnosis       Sinus rhythm with premature supraventricular complexes  Right bundle branch block  Abnormal ECG  No previous ECGs available   Confirmed by Najovits MD, AnMitzi [id number]) on 08/26/2014 4:42:47 PM        9/13 CXR: enlarged cor, basilar atx 9/13 Spiro ratio 51, FEV1 1.1359 (36%), FVC 2.682 (55%) 9/13 MRI of abd: mult hepatic and renal cysts -benign, mild adrenal hyperplasia with no mass9/13 CT Chest: copd changes with apical blebs, possible PH, cad, 5.7 left renal mass -possibly a cyst, 1.4 cm liver mass-possibly cyst10/12/2011 9:14:23 AM > , PFTs ratio 43, FEV1 1.39 (43%), TLC 9.77 (139%), DLCO 18.7 (61%), DLCO/VA 3.88 (106%) 10/13 PSG: AHi 27, RDI 30, desat to 48%, no plms 12/13 titration titrated to 12, had some residual events on it, no PLMS     7/14 spirometry ratio 44, FEV1 1.507 (40%), FVC 3.463 (71%).    4/15 compliance.  AHI  0.3, 99%, 6 hours and 44 minutes  7/15 compliance.  AHI 0.1, 7 hours and 27 minutes, 100% usage, no leaks  7/15 spiro ratio 68, FEV1 1.41, 44%, FVC 3, 64%  1/16 Compliance ahi 0.1 on 6 hrs and 38 mins, 100% usage      ASSESSMENT  10/14 patient seen for follow-up.  Patient is doing well since last time.  His breathing is stable at this time.  We will continue the same inhalers.  He is doing well with his CPAP machine.  I will give him an envelope to mail back his SD card and we will check his AHI data.  Follow-up in 6 months.  Patient does not want the flu shot or pneumonia shot  02/19/2014 Patient seen for follow up.   Doing well since his last visit.  Breathing has been stable.  He doesn't want to do a spirometry yet.  We will repeat on his follow-up.  Sleep has been well.  We will continue with CPAP at nighttime.  AHI 0.3  Follow up in 3 months.  He doesn't want any immunizations for pneumonia.      05/28/2014 Patient seen for follow up.   Patient's been doing well since last time.  His breathing is stable.  Spirometry done in the office today shows his FEV1 is stable at 44%.  We will continue the same inhalers.  He does not want a pneumonia shot.   Sleep apnea is well controlled with an AHI less than 1.  We will continue to same settings.  I will see him in follow-up in 6 months      11/28/2014 Patient seen for follow up.   Copd  Doing well, cont the same inhalers. Renew rescue inhaler  OSA  Controlled on current setting.  Cont the same  F/u in 6 mths  07/16/2015 Patient seen for follow up.   Overnight oximetry off CPAP  Lung cancer screening.  Same inhalers.  ?  Discontinue oxygen          02/09/2016 Patient seen for follow up.   Patient has been doing well since last time he was seen.  He continues to lose weight at this time.    COPD  Currently, stable on current medications.  We will repeat his spirometry on  follow-up.  Sleep apnea.  Currently off CPAP because of weight loss, but we will check his overnight oximetry and if it still low, we will put him back on CPAP.  His pressure may need to be given the fact the fact that he's lost a lot of weight.  Follow up in 6 months        ICD-10-CM ICD-9-CM    1. Chronic obstructive pulmonary disease, unspecified COPD type (HCC) J44.9 496 albuterol (PROVENTIL HFA, VENTOLIN HFA, PROAIR HFA) 90 mcg/actuation inhaler      AMB POC SPIROMETRY      RT--OVERNIGHT OXIMETRY   2. Obstructive sleep apnea syndrome G47.33 327.23 albuterol (PROVENTIL HFA, VENTOLIN HFA, PROAIR HFA) 90 mcg/actuation inhaler      AMB POC SPIROMETRY      RT--OVERNIGHT OXIMETRY           PLAN        08/23/2016 Patient seen for follow up.  COPD  Patient has been doing well since last time he was seen.  His breathing is stable.  Repeat spirometry done in the office today shows his FEV1 is low but lower than before but he has been doing well.  Rescue inhaler has been reordered for him.  We will continue the same inhaler  Patient does not want any immunizations  OSA  -Patient has been using his CPAP machine and has been compliant with it.  Review of his data shows his apneas are  controlled on CPAP at a pressure  of 8.  We will recheck his overnight oximetry while on CPAP to see if he still needs to run oxygen through it.  If that looks good, I will consider repeating an overnight oximetry without the CPAP to see if you and still has to sleep apnea now.  Follow-up in 6 months      Orders Placed This Encounter   ??? AMB POC SPIROMETRY   ??? RT--OVERNIGHT OXIMETRY   ??? albuterol (PROVENTIL HFA, VENTOLIN HFA, PROAIR HFA) 90 mcg/actuation inhaler         There is no immunization history on file for this patient.  Does not want pneumonia shot or flu shot        Monitored Studies    Arlyce Harman 7/15 spiro ratio 68, FEV1 1.41, 44%, FVC 3, 64%    9/16 spirometry ratio of 52, FEV1 2.13, 62%, FVC 4.05, 87%    10/17 ratio 52, FEV1 1.92, 57%, FVC 3.73, 81%   PFTs    CXR    CT chest    PSG/Titration    Compliance 1/16 Compliance ahi 0.1 on 6 hrs and 38 mins, 100% usage  9/16 compliance, 78%, 5 hours and 26 minutes, AHI 0.4 on CPAP 12    10/17 ahi 1 on cpap 8, 5 hrs and 33 mins   Other:    Other:    Other:    Other:    Other:    Other:             Follow-up Disposition:  Return  in about 6 months (around 02/21/2017).  Mora Appl, MD

## 2016-08-23 NOTE — Telephone Encounter (Signed)
Please arrange for overnight oximetry while on cpap.  Dx: copd

## 2016-08-23 NOTE — Patient Instructions (Signed)
Eating Healthy Foods: Care Instructions  Your Care Instructions  Eating healthy foods can help lower your risk for disease. Healthy food gives you energy and keeps your heart strong, your brain active, your muscles working, and your bones strong.  A healthy diet includes a variety of foods from the basic food groups: grains, vegetables, fruits, milk and milk products, and meat and beans. Some people may eat more of their favorite foods from only one food group and, as a result, miss getting the nutrients they need. So, it is important to pay attention not only to what you eat but also to what you are missing from your diet. You can eat a healthy, balanced diet by making a few small changes.  Follow-up care is a key part of your treatment and safety. Be sure to make and go to all appointments, and call your doctor if you are having problems. It's also a good idea to know your test results and keep a list of the medicines you take.  How can you care for yourself at home?  Look at what you eat  ?? Keep a food diary for a week or two and record everything you eat or drink. Track the number of servings you eat from each food group.  ?? For a balanced diet every day, eat a variety of:  ?? 6 or more ounce-equivalents of grains, such as cereals, breads, crackers, rice, or pasta, every day. An ounce-equivalent is 1 slice of bread, 1 cup of ready-to-eat cereal, or ?? cup of cooked rice, cooked pasta, or cooked cereal.  ?? 2?? cups of vegetables, especially:  ?? Dark-[place] vegetables such as broccoli and spinach.  ?? Orange vegetables such as carrots and sweet potatoes.  ?? Dry beans (such as pinto and kidney beans) and peas (such as lentils).  ?? 2 cups of fresh, frozen, or canned fruit. A small apple or 1 banana or orange equals 1 cup.  ?? 3 cups of nonfat or low-fat milk, yogurt, or other milk products.  ?? 5?? ounces of meat and beans, such as chicken, fish, lean meat, beans,  nuts, and seeds. One egg, 1 tablespoon of peanut butter, ?? ounce nuts or seeds, or ?? cup of cooked beans equals 1 ounce of meat.  ?? Learn how to read food labels for serving sizes and ingredients. Fast-food and convenience-food meals often contain few or no fruits or vegetables. Make sure you eat some fruits and vegetables to make the meal more nutritious.  ?? Look at your food diary. For each food group, add up what you have eaten and then divide the total by the number of days. This will give you an idea of how much you are eating from each food group. See if you can find some ways to change your diet to make it more healthy.  Start small  ?? Do not try to make dramatic changes to your diet all at once. You might feel that you are missing out on your favorite foods and then be more likely to fail.  ?? Start slowly, and gradually change your habits. Try some of the following:  ?? Use whole wheat bread instead of white bread.  ?? Use nonfat or low-fat milk instead of whole milk.  ?? Eat brown rice instead of white rice, and eat whole wheat pasta instead of white-flour pasta.  ?? Try low-fat cheeses and low-fat yogurt.  ?? Add more fruits and vegetables to meals and have them for snacks.  ??   Add lettuce, tomato, cucumber, and onion to sandwiches.  ?? Add fruit to yogurt and cereal.  Enjoy food  ?? You can still eat your favorite foods. You just may need to eat less of them. If your favorite foods are high in fat, salt, and sugar, limit how often you eat them, but do not cut them out entirely.  ?? Eat a wide variety of foods.  Make healthy choices when eating out  ?? The type of restaurant you choose can help you make healthy choices. Even fast-food chains are now offering more low-fat or healthier choices on the menu.  ?? Choose smaller portions, or take half of your meal home.  ?? When eating out, try:  ?? A veggie pizza with a whole wheat crust or grilled chicken (instead of sausage or pepperoni).   ?? Pasta with roasted vegetables, grilled chicken, or marinara sauce instead of cream sauce.  ?? A vegetable wrap or grilled chicken wrap.  ?? Broiled or poached food instead of fried or breaded items.  Make healthy choices easy  ?? Buy packaged, prewashed, ready-to-eat fresh vegetables and fruits, such as baby carrots, salad mixes, and chopped or shredded broccoli and cauliflower.  ?? Buy packaged, presliced fruits, such as melon or pineapple.  ?? Choose 100% fruit or vegetable juice instead of soda. Limit juice intake to 4 to 6 oz (?? to ?? cup) a day.  ?? Blend low-fat yogurt, fruit juice, and canned or frozen fruit to make a smoothie for breakfast or a snack.  Where can you learn more?  Go to http://www.healthwise.net/GoodHelpConnections.  Enter T756 in the search box to learn more about "Eating Healthy Foods: Care Instructions."  Current as of: February 09, 2016  Content Version: 11.3  ?? 2006-2017 [employer], [employer]. Care instructions adapted under license by [employer] (which disclaims liability or warranty for this information). If you have questions about a medical condition or this instruction, always ask your healthcare professional. [employer]wise, Inc[employer] disclaims any warranty or liability for your use of this information.

## 2016-08-23 NOTE — Telephone Encounter (Signed)
RX faxed to [facility]

## 2016-09-20 ENCOUNTER — Telehealth

## 2016-09-20 NOTE — Telephone Encounter (Signed)
Please order a repeat overnight oximetry with no cpap and no oxygen.  Dx: copd

## 2016-09-21 NOTE — Telephone Encounter (Signed)
To heather

## 2016-09-21 NOTE — Telephone Encounter (Signed)
RX faxed to [facility].  Patient informed

## 2016-11-18 ENCOUNTER — Telehealth

## 2016-11-18 NOTE — Telephone Encounter (Signed)
Tell him to go back on his cpap machine.

## 2016-11-18 NOTE — Telephone Encounter (Signed)
Results of overnight pulse ox received and scanned to chart.  Reason for the test was to see if patient still needed 02 at night which he does. Can I advise him of this?

## 2016-11-19 NOTE — Telephone Encounter (Signed)
Patient informed and states he has not stopped using CPAP it is the 02 he does not want anymore stating he has not used it in 3 months.  Is it okay to D/C the 02?

## 2016-11-19 NOTE — Telephone Encounter (Signed)
Patient left message to follow up on results.  In message he said reason for test was to see if he still needed 02 at night.  Is to use both CPAP & 02?

## 2016-11-19 NOTE — Telephone Encounter (Signed)
Ok to d/c O2

## 2016-11-19 NOTE — Telephone Encounter (Signed)
He was not desaturating with the cpap.  The reason for the study was to see whether he still had sleep apnea and did not need the cpap anymore.  Have him go back on cpap.

## 2016-11-22 NOTE — Telephone Encounter (Signed)
RX faxed to [facility].  Patient informed

## 2017-02-21 ENCOUNTER — Encounter: Attending: Internal Medicine | Primary: Family Medicine

## 2017-03-08 ENCOUNTER — Ambulatory Visit: Admit: 2017-03-08 | Discharge: 2017-03-08 | Payer: MEDICARE | Attending: Internal Medicine | Primary: Family Medicine

## 2017-03-08 DIAGNOSIS — J449 Chronic obstructive pulmonary disease, unspecified: Secondary | ICD-10-CM

## 2017-03-08 NOTE — Patient Instructions (Signed)
Eating Healthy Foods: Care Instructions  Your Care Instructions    Eating healthy foods can help lower your risk for disease. Healthy food gives you energy and keeps your heart strong, your brain active, your muscles working, and your bones strong.  A healthy diet includes a variety of foods from the basic food groups: grains, vegetables, fruits, milk and milk products, and meat and beans. Some people may eat more of their favorite foods from only one food group and, as a result, miss getting the nutrients they need. So, it is important to pay attention not only to what you eat but also to what you are missing from your diet. You can eat a healthy, balanced diet by making a few small changes.  Follow-up care is a key part of your treatment and safety. Be sure to make and go to all appointments, and call your doctor if you are having problems. It's also a good idea to know your test results and keep a list of the medicines you take.  How can you care for yourself at home?  Look at what you eat  ?? Keep a food diary for a week or two and record everything you eat or drink. Track the number of servings you eat from each food group.  ?? For a balanced diet every day, eat a variety of:  ?? 6 or more ounce-equivalents of grains, such as cereals, breads, crackers, rice, or pasta, every day. An ounce-equivalent is 1 slice of bread, 1 cup of ready-to-eat cereal, or ?? cup of cooked rice, cooked pasta, or cooked cereal.  ?? 2?? cups of vegetables, especially:  ?? Dark-Biever vegetables such as broccoli and spinach.  ?? Orange vegetables such as carrots and sweet potatoes.  ?? Dry beans (such as pinto and kidney beans) and peas (such as lentils).  ?? 2 cups of fresh, frozen, or canned fruit. A small apple or 1 banana or orange equals 1 cup.  ?? 3 cups of nonfat or low-fat milk, yogurt, or other milk products.  ?? 5?? ounces of meat and beans, such as chicken, fish, lean meat, beans,  nuts, and seeds. One egg, 1 tablespoon of peanut butter, ?? ounce nuts or seeds, or ?? cup of cooked beans equals 1 ounce of meat.  ?? Learn how to read food labels for serving sizes and ingredients. Fast-food and convenience-food meals often contain few or no fruits or vegetables. Make sure you eat some fruits and vegetables to make the meal more nutritious.  ?? Look at your food diary. For each food group, add up what you have eaten and then divide the total by the number of days. This will give you an idea of how much you are eating from each food group. See if you can find some ways to change your diet to make it more healthy.  Start small  ?? Do not try to make dramatic changes to your diet all at once. You might feel that you are missing out on your favorite foods and then be more likely to fail.  ?? Start slowly, and gradually change your habits. Try some of the following:  ?? Use whole wheat bread instead of white bread.  ?? Use nonfat or low-fat milk instead of whole milk.  ?? Eat brown rice instead of white rice, and eat whole wheat pasta instead of white-flour pasta.  ?? Try low-fat cheeses and low-fat yogurt.  ?? Add more fruits and vegetables to meals and have them for   snacks.  ?? Add lettuce, tomato, cucumber, and onion to sandwiches.  ?? Add fruit to yogurt and cereal.  Enjoy food  ?? You can still eat your favorite foods. You just may need to eat less of them. If your favorite foods are high in fat, salt, and sugar, limit how often you eat them, but do not cut them out entirely.  ?? Eat a wide variety of foods.  Make healthy choices when eating out  ?? The type of restaurant you choose can help you make healthy choices. Even fast-food chains are now offering more low-fat or healthier choices on the menu.  ?? Choose smaller portions, or take half of your meal home.  ?? When eating out, try:  ?? A veggie pizza with a whole wheat crust or grilled chicken (instead of sausage or pepperoni).   ?? Pasta with roasted vegetables, grilled chicken, or marinara sauce instead of cream sauce.  ?? A vegetable wrap or grilled chicken wrap.  ?? Broiled or poached food instead of fried or breaded items.  Make healthy choices easy  ?? Buy packaged, prewashed, ready-to-eat fresh vegetables and fruits, such as baby carrots, salad mixes, and chopped or shredded broccoli and cauliflower.  ?? Buy packaged, presliced fruits, such as melon or pineapple.  ?? Choose 100% fruit or vegetable juice instead of soda. Limit juice intake to 4 to 6 oz (?? to ?? cup) a day.  ?? Blend low-fat yogurt, fruit juice, and canned or frozen fruit to make a smoothie for breakfast or a snack.  Where can you learn more?  Go to http://www.healthwise.net/[facility].  Enter T756 in the search box to learn more about "Eating Healthy Foods: Care Instructions."  Current as of: Mar 19, 2016  Content Version: 11.4  ?? 2006-2017 [employer]. Care instructions adapted under license by [employer] (which disclaims liability or warranty for this information). If you have questions about a medical condition or this instruction, always ask your healthcare professional. Healthwise, Incorporated disclaims any warranty or liability for your use of this information.

## 2017-03-08 NOTE — Progress Notes (Signed)
PULMONARY/SLEEP MEDICINE    04/04/2017     CHIEF COMPLAINT: had concerns including COPD.    HISTORY OF PRESENT ILLNESS: Donald Charles presents for had concerns including COPD.            Pulmonary/Sleep Meds Advair, alb, oxygen conc   Failed Meds        Breathing has been well.  No cough, phlegm, wheezing, chest pain.  No fevers, chills.  No sinus issues.  No heartburn.  Has not needed resc inhalers      Sleep has been well  Goes to sleep at 12 am and wakes up at 7-8 am  Wakes up feeling rested  No snoring with the machine  Daytime energy is good  No naps  No trouble driving  Mask is comfortable  Pressure is good  Gets Replacements  Weight is stable                Past Medical History:   Diagnosis Date   ??? BPH (benign prostatic hyperplasia)    ??? COPD (chronic obstructive pulmonary disease) ([place])    ??? Hyperlipidemia    ??? Hypertension    ??? OSA on CPAP      Past Surgical History:   Procedure Laterality Date   ??? 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 albuterol (PROVENTIL HFA, VENTOLIN HFA, PROAIR HFA) 90 mcg/actuation inhaler Take 2 Puffs by inhalation every four (4) hours as needed. Pt. Wants ProAir HFA 3 Inhaler 3   ??? ADVAIR DISKUS 250-50 mcg/dose diskus inhaler USE 1 INHALATION EVERY 12 HOURS 180 Each 3   ??? irbesartan (AVAPRO) 75 mg tablet Take 75 mg by mouth nightly.     ??? cpap machine kit by Does Not Apply route. At 12     ??? metoprolol succinate (TOPROL-XL) 100 mg XL tablet Take  by mouth daily.       I attest that current medications are reviewed and are accurate.    No Known Allergies  Social History     Social History   ??? Marital status: DIVORCED     Spouse name: N/A   ??? Number of children: N/A   ??? Years of education: N/A     Social History Main Topics   ??? Smoking status: Former Smoker     Packs/day: 1.00     Years: 30.00   ??? Smokeless tobacco: Never Used      Comment: quit 15 yrs ago   ??? Alcohol use Yes      Comment: occasional    ??? Drug use: No   ??? Sexual activity: Not Asked     Other Topics Concern   ??? None     Social History Narrative     Family History   Problem Relation Age of Onset   ??? Cancer Mother      lung        REVIEW OF SYSTEMS:   no dyspnea.  no hemoptysis.  no cough.  no orthopnea.  no wheeze.  no sputum production. no fever, sweats, or chills.  no unusual fatigue.  no loss of appetite.  no weight loss more than 5 lbs.  no headaches.  no ear aches.  no eye irritation.  no blurred or double vision.  no nose or sinus problems, including hay fever.  no dry eyes or dry mouth.  no snoring.  no breast discomfort.  no chest pain.  no irregular or rapid heart beats.  no heartburn or indigestion.  no difficulty swallowing or regurgitation.  no nausea or vomiting.  no abdominl pain.  no diarrhea.  no constipation.  no difficult or painful urination.  no frequent urination.  no swelling at the ankles.  no joint pains or muscle aches.  no fingers turn white and painful in the cold.  no back pain or neck pain.  no automobile accident or other serios injury.  no unusual dizziness, faintness or loss of consciousness.  no numbness or weakness of part of your body.  no anxiety.  no depression.          PHYSICAL EXAM:   Visit Vitals   ??? BP 138/80   ??? Pulse 81   ??? Ht '5\' 11"'  (1.803 m)   ??? Wt 115.2 kg (254 lb)   ??? SpO2 93%   ??? BMI 35.43 kg/m2      General Appearance: NAD, pleasant, well built and nourished  HEENT: no thrush, no mucositis, TM's normal, turbinates normal, oropharynx normal  Oral Cavity: normal teeth, no lesions  Neck, Thyroid: supple, no lymphadenopathy, trachea at midline, no thyromegaly, JVP flat  Heart: regular rate and rhythm, S1, S2 without murmur.    Lungs: clear to auscultation, good air entry bilaterally.    Chest: normal shape and expansion, no use of accessory muscles, clear to percussion, normal fremitus  Abdomen: soft, NT/ND, BS present, no masses palpated, no hepatosplenomegaly   Extremities: no cyanosis, no clubbing, no edema.    Peripheral pulses: normal (2+) bilaterally  Neurologic: no focal signs, awake and alert, oriented x 3, normal cranial nerves II-XII sensory and motor wnl, DTR 2 plus, gait normal, normal sensation and strength  Lymph nodes not palpable  Skin: warm, dry, normal, no rash  Back: no midline or CVA tenderness  Lymphatics: lymphoedema absent      LABS AND TESTING:   Results for orders placed or performed during the hospital encounter of 08/26/14   PTT   Result Value Ref Range    aPTT 23.7 22.0 - 29.0 SEC   CBC W/O DIFF   Result Value Ref Range    WBC 10.9 (H) 4.8 - 10.6 K/uL    RBC 4.51 (L) 4.70 - 6.00 M/uL    HGB 13.6 (L) 14.0 - 18.0 g/dL    HCT 42.9 42.0 - 52.0 %    MCV 95.1 (H) 81.0 - 94.0 FL    MCH 30.2 27.0 - 35.0 PG    MCHC 31.7 30.7 - 37.3 g/dL    RDW 14.5 (H) 11.5 - 14.0 %    PLATELET 217 130 - 400 K/uL    MPV 11.2 9.2 - 56.2 FL   METABOLIC PANEL, BASIC   Result Value Ref Range    Sodium 139 136 - 145 mmol/L    Potassium 4.4 3.5 - 5.1 mmol/L    Chloride 101 98 - 107 mmol/L    CO2 31 21 - 32 mmol/L    Anion gap 12 10 - 20 mmol/L    Glucose 95 74 - 106 mg/dL    BUN 26 (H) 7 - 18 mg/dL    Creatinine 1.3 0.6 - 1.3 mg/dL    GFR est AA >60 >60 ml/min/1.44m    GFR est non-AA 58 (L) >60 ml/min/1.76m   Calcium 8.8 8.5 - 10.1 mg/dL   EKG, 12 LEAD, INITIAL   Result Value Ref Range    Ventricular Rate  67 BPM    Atrial Rate 67 BPM    P-R Interval 188 ms    QRS Duration 136 ms    Q-T Interval 446 ms    QTC Calculation (Bezet) 471 ms    Calculated P Axis 65 degrees    Calculated R Axis 87 degrees    Calculated T Axis 63 degrees    Diagnosis       Sinus rhythm with premature supraventricular complexes  Right bundle branch block  Abnormal ECG  No previous ECGs available  Confirmed by Najovits MD, Mitzi Hansen ([id number]) on 08/26/2014 4:42:47 PM        9/13 CXR: enlarged cor, basilar atx 9/13 Spiro ratio 51, FEV1 1.1359 (36%), FVC 2.682 (55%) 9/13 MRI of abd: mult hepatic and renal cysts  -benign, mild adrenal hyperplasia with no mass9/13 CT Chest: copd changes with apical blebs, possible PH, cad, 5.7 left renal mass -possibly a cyst, 1.4 cm liver mass-possibly cyst10/12/2011 9:14:23 AM > , PFTs ratio 43, FEV1 1.39 (43%), TLC 9.77 (139%), DLCO 18.7 (61%), DLCO/VA 3.88 (106%) 10/13 PSG: AHi 27, RDI 30, desat to 48%, no plms 12/13 titration titrated to 12, had some residual events on it, no PLMS     7/14 spirometry ratio 44, FEV1 1.507 (40%), FVC 3.463 (71%).    4/15 compliance.  AHI  0.3, 99%, 6 hours and 44 minutes  7/15 compliance.  AHI 0.1, 7 hours and 27 minutes, 100% usage, no leaks  7/15 spiro ratio 68, FEV1 1.41, 44%, FVC 3, 64%  1/16 Compliance ahi 0.1 on 6 hrs and 38 mins, 100% usage      ASSESSMENT  10/14 patient seen for follow-up.  Patient is doing well since last time.  His breathing is stable at this time.  We will continue the same inhalers.  He is doing well with his CPAP machine.  I will give him an envelope to mail back his SD card and we will check his AHI data.  Follow-up in 6 months.  Patient does not want the flu shot or pneumonia shot  02/19/2014 Patient seen for follow up.   Doing well since his last visit.  Breathing has been stable.  He doesn't want to do a spirometry yet.  We will repeat on his follow-up.  Sleep has been well.  We will continue with CPAP at nighttime.  AHI 0.3  Follow up in 3 months.  He doesn't want any immunizations for pneumonia.      05/28/2014 Patient seen for follow up.   Patient's been doing well since last time.  His breathing is stable.  Spirometry done in the office today shows his FEV1 is stable at 44%.  We will continue the same inhalers.  He does not want a pneumonia shot.  Sleep apnea is well controlled with an AHI less than 1.  We will continue to same settings.  I will see him in follow-up in 6 months      11/28/2014 Patient seen for follow up.   Copd  Doing well, cont the same inhalers. Renew rescue inhaler  OSA   Controlled on current setting.  Cont the same  F/u in 6 mths      07/16/2015 Patient seen for follow up.   Overnight oximetry off CPAP  Lung cancer screening.  Same inhalers.  ?  Discontinue oxygen          02/09/2016 Patient seen for follow up.   Patient has been doing well since last time he  was seen.  He continues to lose weight at this time.    COPD  Currently, stable on current medications.  We will repeat his spirometry on  follow-up.  Sleep apnea.  Currently off CPAP because of weight loss, but we will check his overnight oximetry and if it still low, we will put him back on CPAP.  His pressure may need to be given the fact the fact that he's lost a lot of weight.  Follow up in 6 months      08/23/2016 Patient seen for follow up.  COPD  Patient has been doing well since last time he was seen.  His breathing is stable.  Repeat spirometry done in the office today shows his FEV1 is low but lower than before but he has been doing well.  Rescue inhaler has been reordered for him.  We will continue the same inhaler  Patient does not want any immunizations  OSA  -Patient has been using his CPAP machine and has been compliant with it.  Review of his data shows his apneas are  controlled on CPAP at a pressure of 8.  We will recheck his overnight oximetry while on CPAP to see if he still needs to run oxygen through it.  If that looks good, I will consider repeating an overnight oximetry without the CPAP to see if you and still has to sleep apnea now.  Follow-up in 6 months          ICD-10-CM ICD-9-CM    1. Chronic obstructive pulmonary disease, unspecified COPD type ([place]) J44.9 496    2. Obstructive sleep apnea syndrome G47.33 327.23            PLAN        03/08/17  Has been doing well.  Moving to [place]  F/u as needed                    COPD  Patient has been doing well since last time he was seen.  His breathing is stable.  Repeat spirometry done in the office today shows his FEV1 is low  but lower than before but he has been doing well.  Rescue inhaler has been reordered for him.  We will continue the same inhaler  Patient does not want any immunizations  OSA  -Patient has been using his CPAP machine and has been compliant with it.  Review of his data shows his apneas are  controlled on CPAP at a pressure of 8.  We will recheck his overnight oximetry while on CPAP to see if he still needs to run oxygen through it.  If that looks good, I will consider repeating an overnight oximetry without the CPAP to see if you and still has to sleep apnea now.  Follow-up in 6 months      No orders of the defined types were placed in this encounter.        There is no immunization history on file for this patient.  Does not want pneumonia shot or flu shot        Monitored Studies    Arlyce Harman 7/15 spiro ratio 68, FEV1 1.41, 44%, FVC 3, 64%    9/16 spirometry ratio of 52, FEV1 2.13, 62%, FVC 4.05, 87%    10/17 ratio 52, FEV1 1.92, 57%, FVC 3.73, 81%           PFTs    CXR    CT chest    PSG/Titration  Compliance 1/16 Compliance ahi 0.1 on 6 hrs and 38 mins, 100% usage  9/16 compliance, 78%, 5 hours and 26 minutes, AHI 0.4 on CPAP 12    10/17 ahi 1 on cpap 8, 5 hrs and 33 mins    4/18 5 hrs and 51 mins, ahi 0.9 on cpap 8     Other:    Other:    Other:    Other:    Other:    Other:             Follow-up Disposition:  Return if symptoms worsen or fail to improve.  Mora Appl, MD

## 2017-08-02 MED ORDER — ADVAIR DISKUS 250 MCG-50 MCG/DOSE POWDER FOR INHALATION
250-50 mcg/dose | RESPIRATORY_TRACT | 3 refills | Status: AC
Start: 2017-08-02 — End: ?

## 2017-10-22 ENCOUNTER — Encounter

## 2017-10-24 MED ORDER — PROAIR HFA 90 MCG/ACTUATION AEROSOL INHALER
90 mcg/actuation | RESPIRATORY_TRACT | 3 refills | Status: AC
Start: 2017-10-24 — End: ?

## 2018-02-15 MED ORDER — ALBUTEROL SULFATE HFA 90 MCG/ACTUATION AEROSOL INHALER
90 mcg/actuation | Freq: Four times a day (QID) | RESPIRATORY_TRACT | 2 refills | Status: AC | PRN
Start: 2018-02-15 — End: ?

## 2018-02-15 NOTE — Telephone Encounter (Signed)
rx sent

## 2018-02-15 NOTE — Telephone Encounter (Signed)
Gulander Reed from [facility] called.  Requesting an alternative medication for proair hfa. The pts insurance will not cover it anymore.  recommendtion would be: ventolin hfa inhaler.    Please advise    Ph- [phone]

## 2022-05-25 IMAGING — CT CT CALCIUM SCORING
1 series · 15 of 20 positions shown, 19 images · non-contrast
Comparison: There are no previous exams available for comparison.

________________________________________________________________________________________________ 
CT CALCIUM SCORING, 05/25/2022 [DATE]:
INDICATION: Evaluate coronary artery calcification.

[Series 2: cascoreseq 3.0 b35s 60% · axial · 0.52mm/px · z∈[-298,-145]mm · 15 of 114 slices shown, 19 images]
[im 6/114  vessel]
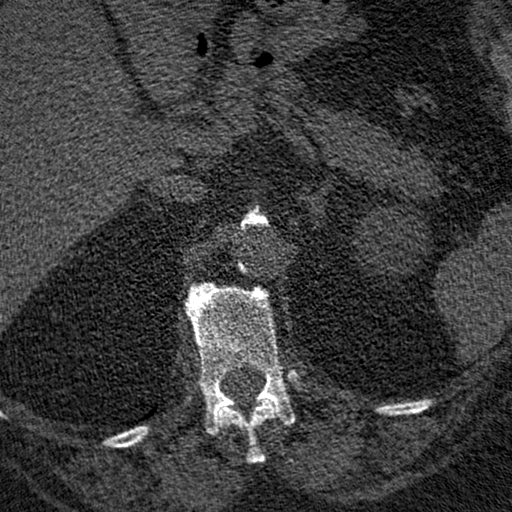
[im 6/114  lung]
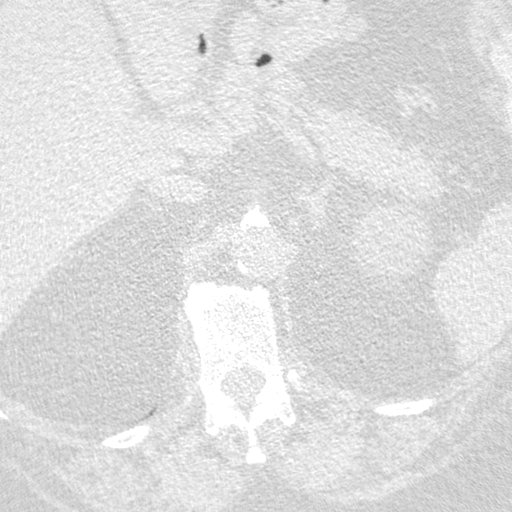
[im 12/114  vessel]
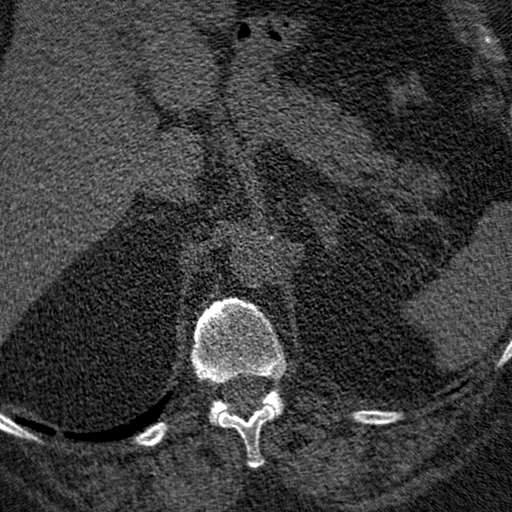
[im 24/114  vessel]
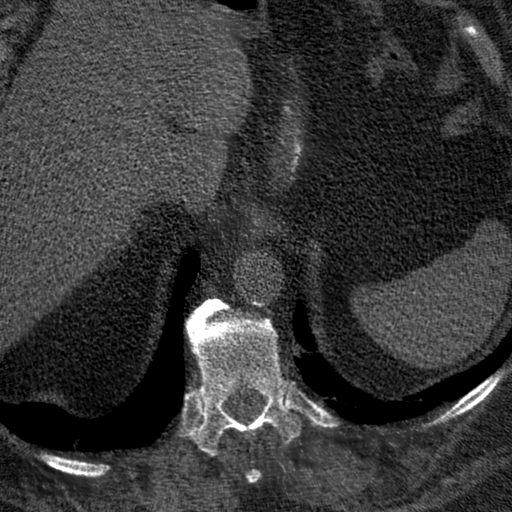
[im 30/114  vessel]
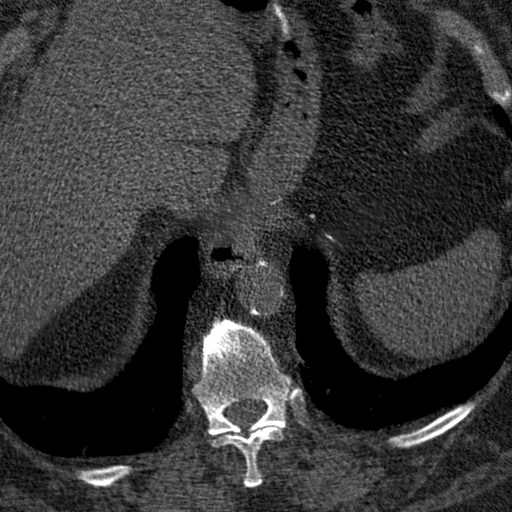
[im 36/114  vessel]
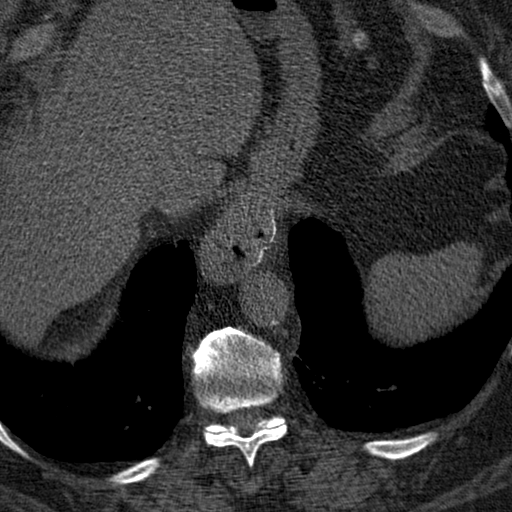
[im 36/114  lung]
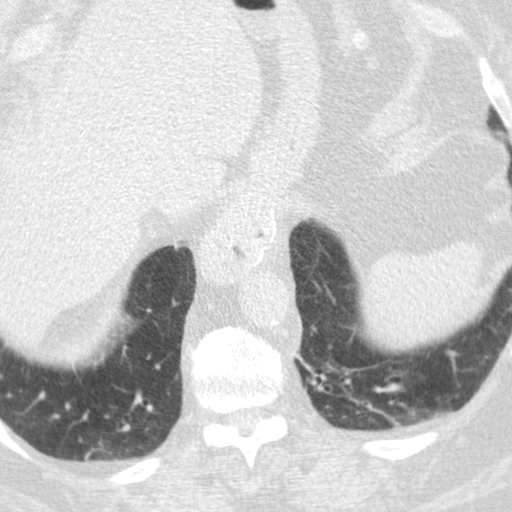
[im 42/114  vessel]
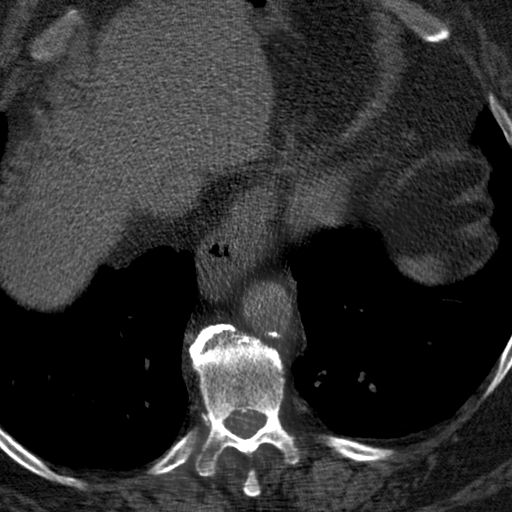
[im 48/114  vessel]
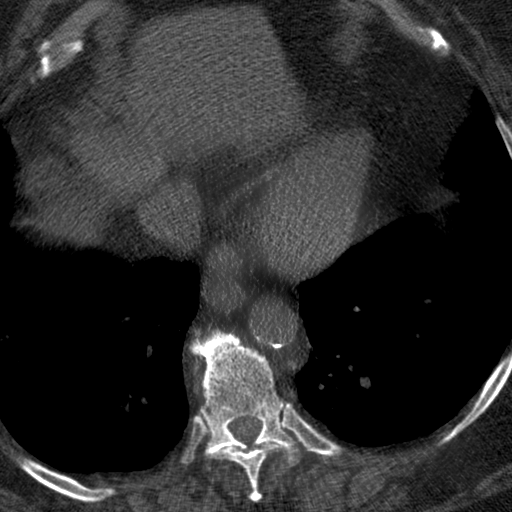
[im 60/114  vessel]
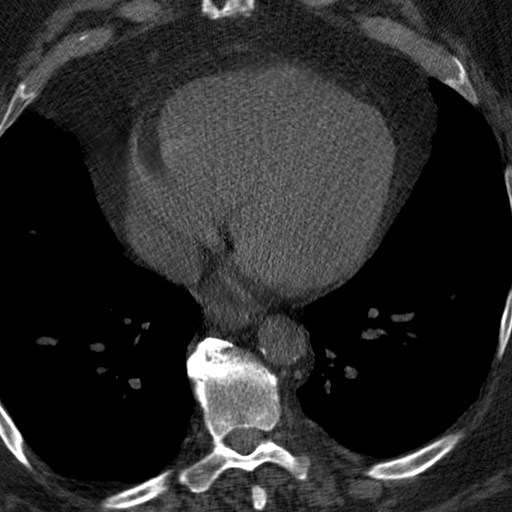
[im 66/114  vessel]
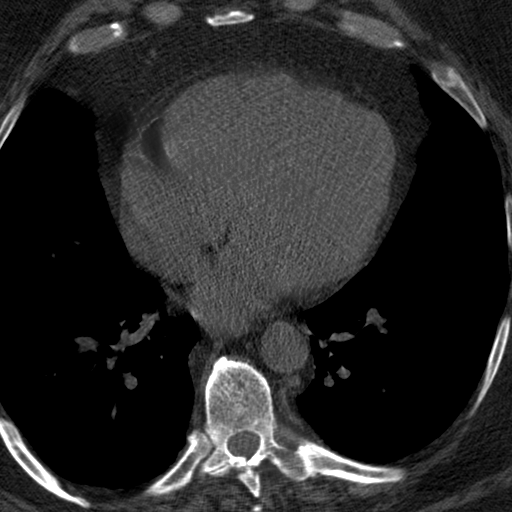
[im 66/114  lung]
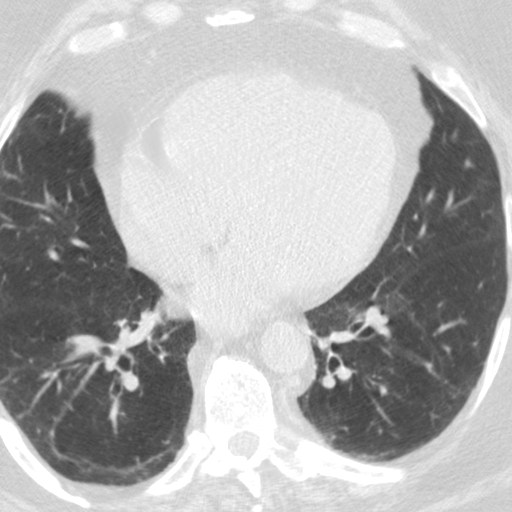
[im 72/114  vessel]
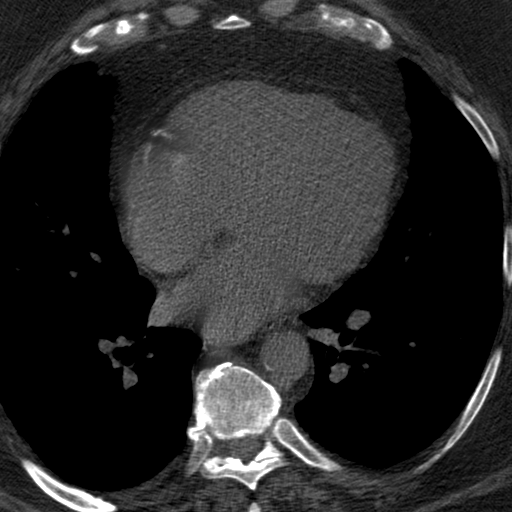
[im 78/114  vessel]
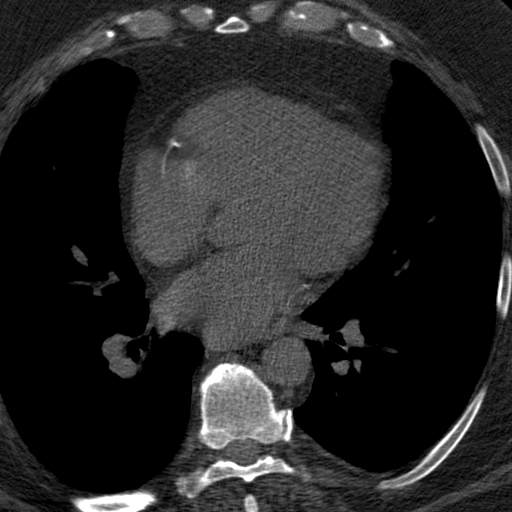
[im 84/114  vessel]
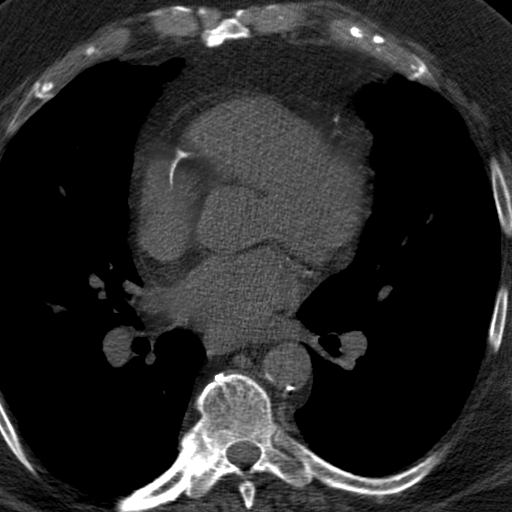
[im 96/114  vessel]
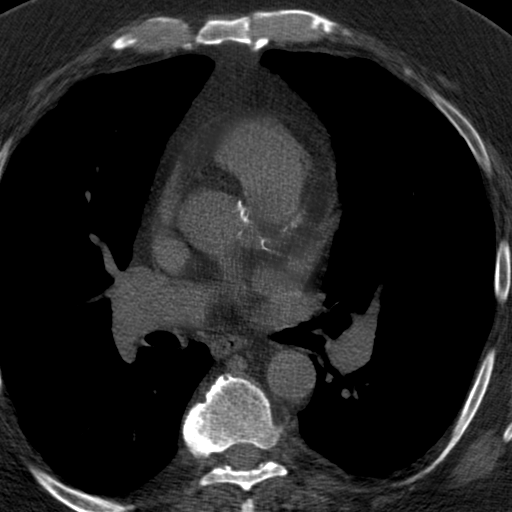
[im 96/114  lung]
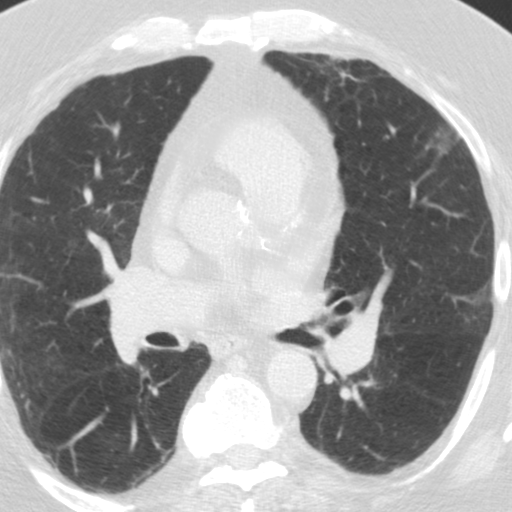
[im 102/114  vessel]
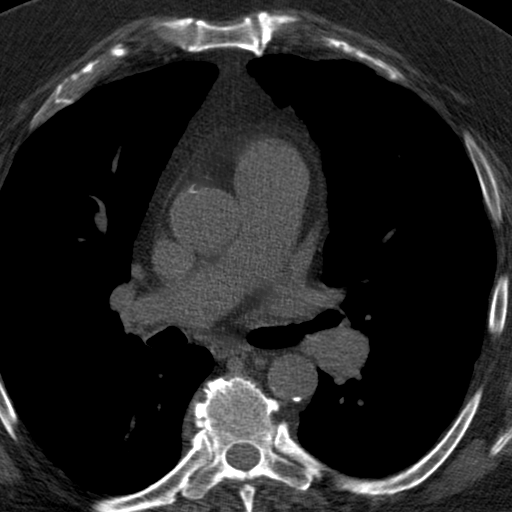
[im 108/114  vessel]
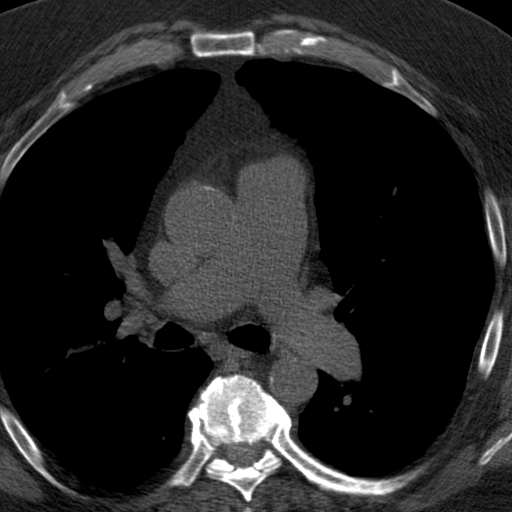

[15 of 20 positions shown; findings below may reference images not displayed]

FINDINGS: Miles cylindrical bronchiectasis, minimal paraseptal emphysema and 
scattered atelectasis. Atherosclerosis. Enlarged pulmonary arteries with the 
main pulmonary artery measuring 4.2 cm in diameter, consistent with pulmonary 
hypertension. Degenerative change. Small hiatal hernia. Postsurgical change of 
the stomach.
IMPRESSION: Total Calcium Score: 8428 
Calcium score                                     Presence of Plaque 
0                                                    No evidence of plaque 
1-10                                               Minimal evidence of plaque 
11-100                                            Mild evidence of plaque 
101-400                                          Moderate evidence of plaque 
Over 400                                        Extensive evidence of plaque     

Calcium scoring sheets to follow. 
RADIATION DOSE REDUCTION: All CT scans are performed using radiation dose 
reduction techniques, when applicable.  Technical factors are evaluated and 
adjusted to ensure appropriate moderation of exposure.  Automated dose 
management technology is applied to adjust the radiation doses to minimize 
exposure while achieving diagnostic quality images.

## 2023-02-03 IMAGING — DX LUMBAR SPINE COMPLETE 4 VIEWS
1 series · 4 of 4 positions shown · non-contrast
Comparison: None

________________________________________________________________________________________________ 
LUMBAR SPINE COMPLETE 4 VIEWS, 02/03/2023 [DATE]: 
CLINICAL INDICATION:  Low back pain

[Series 1: AP · U · 0.14mm/px · 4 of 4 slices shown]
[im 1/4]
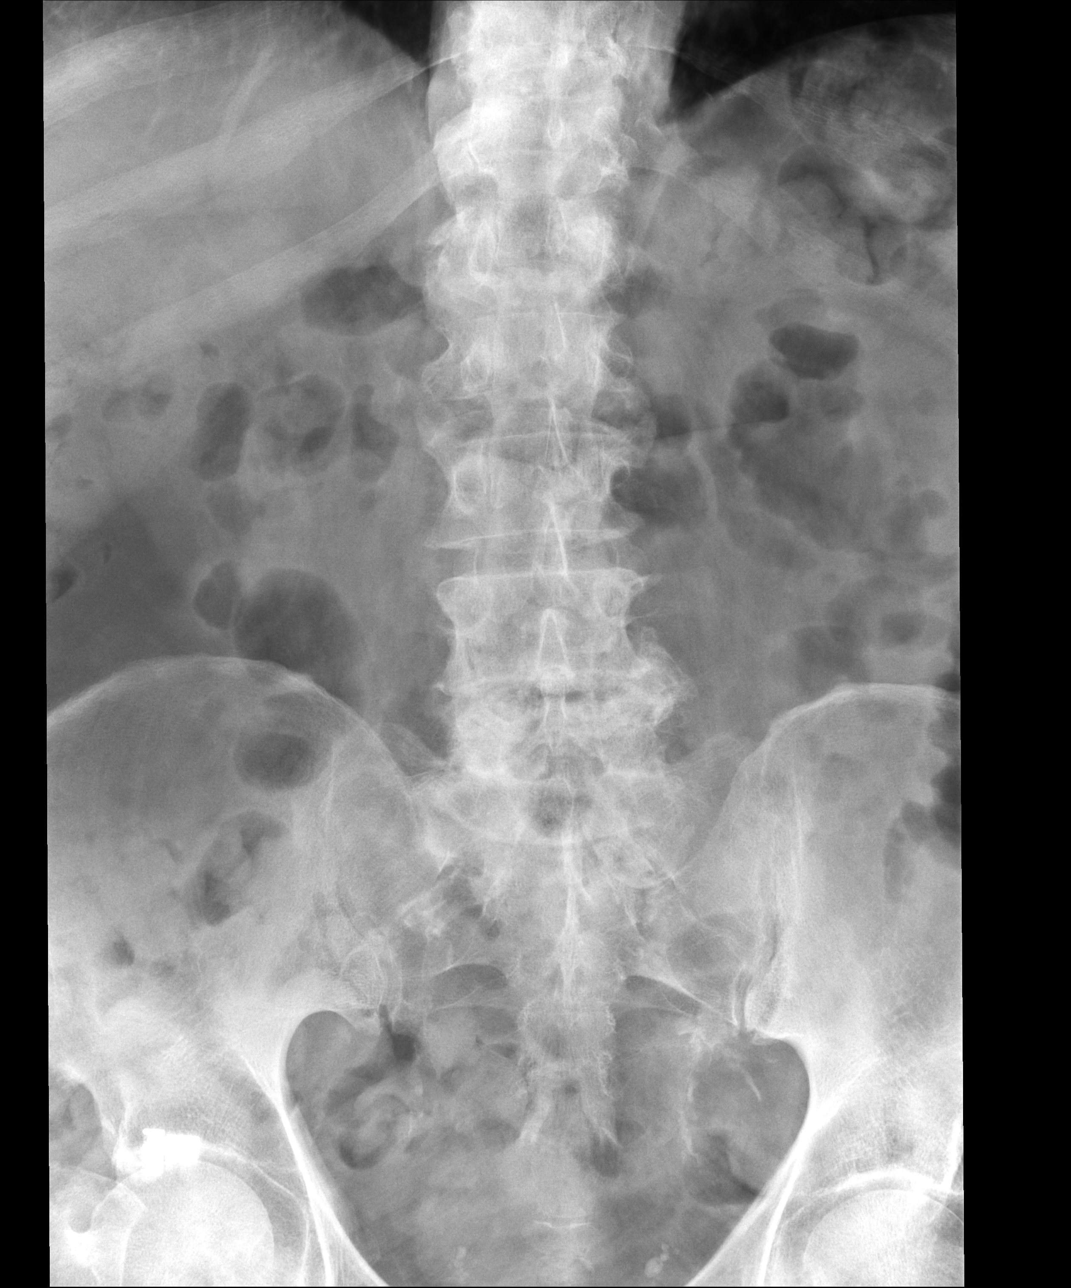
[im 2/4]
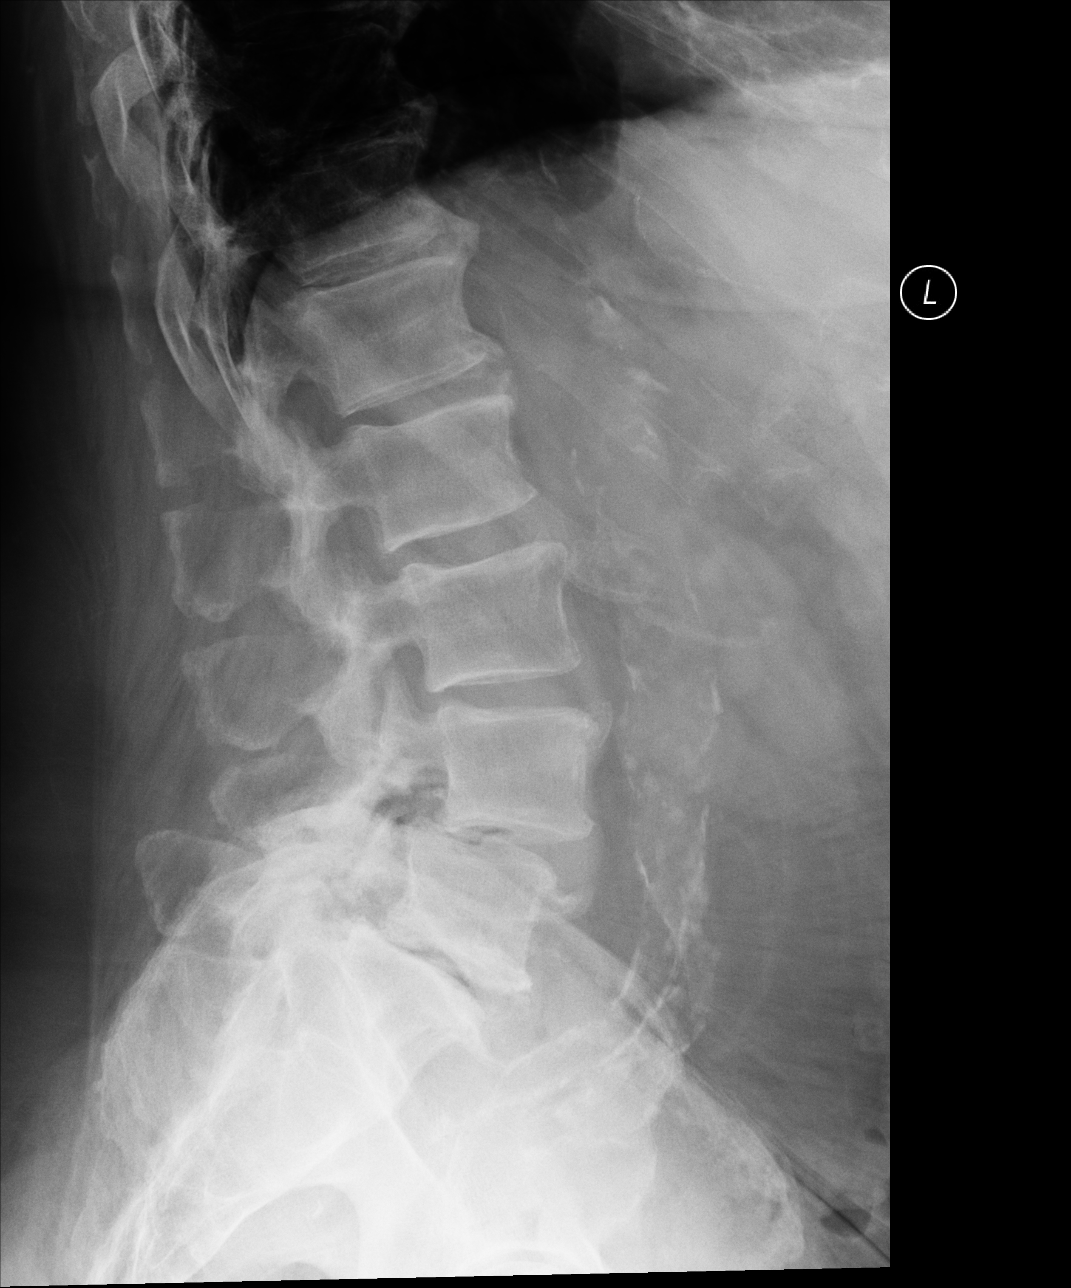
[im 3/4]
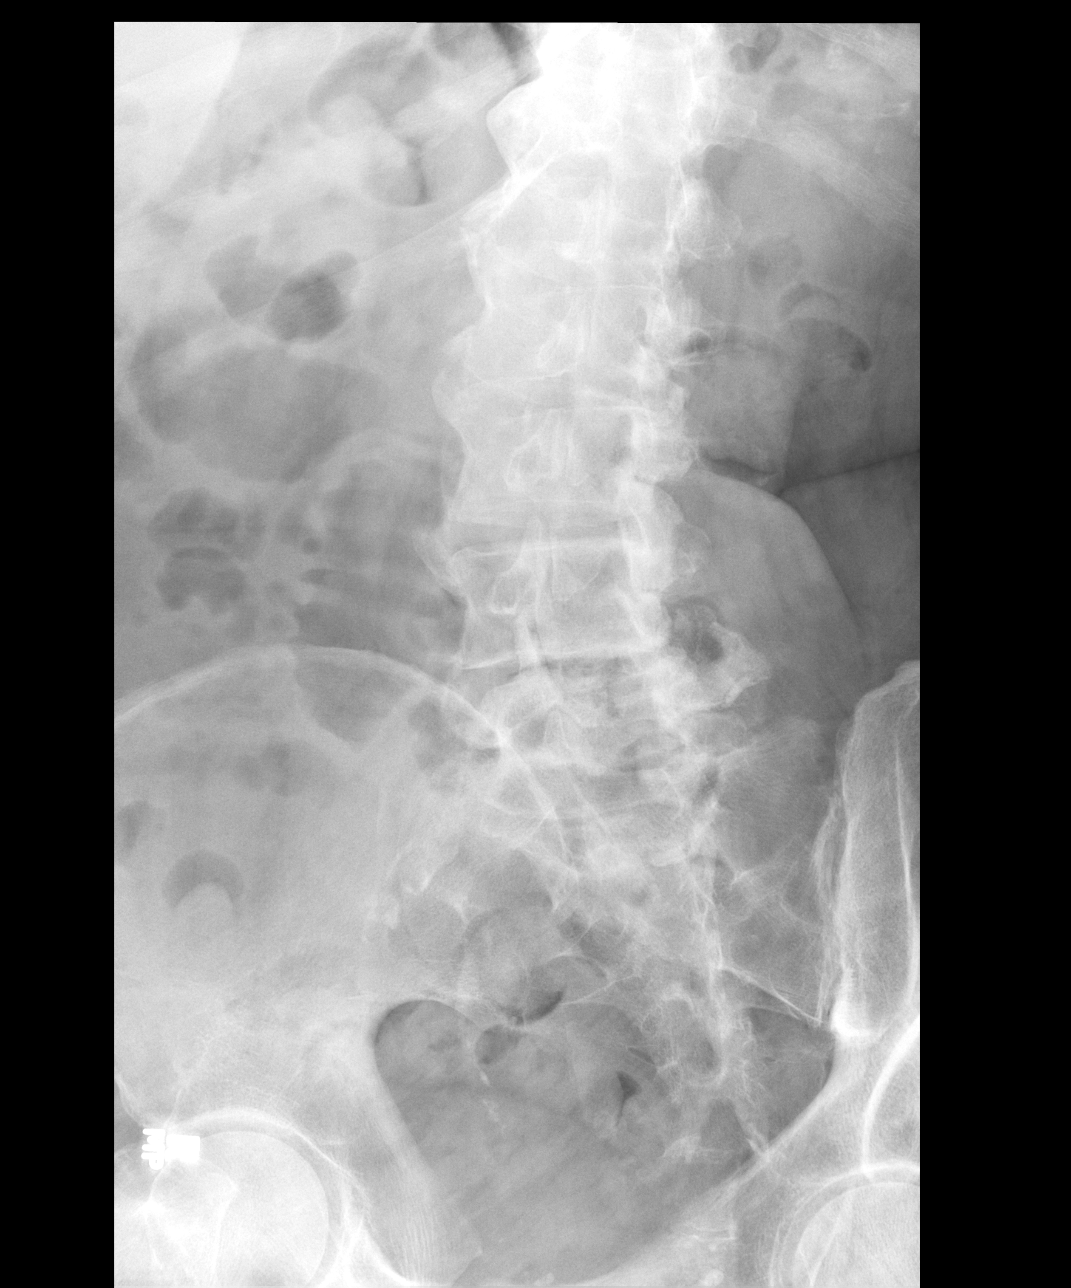
[im 4/4]
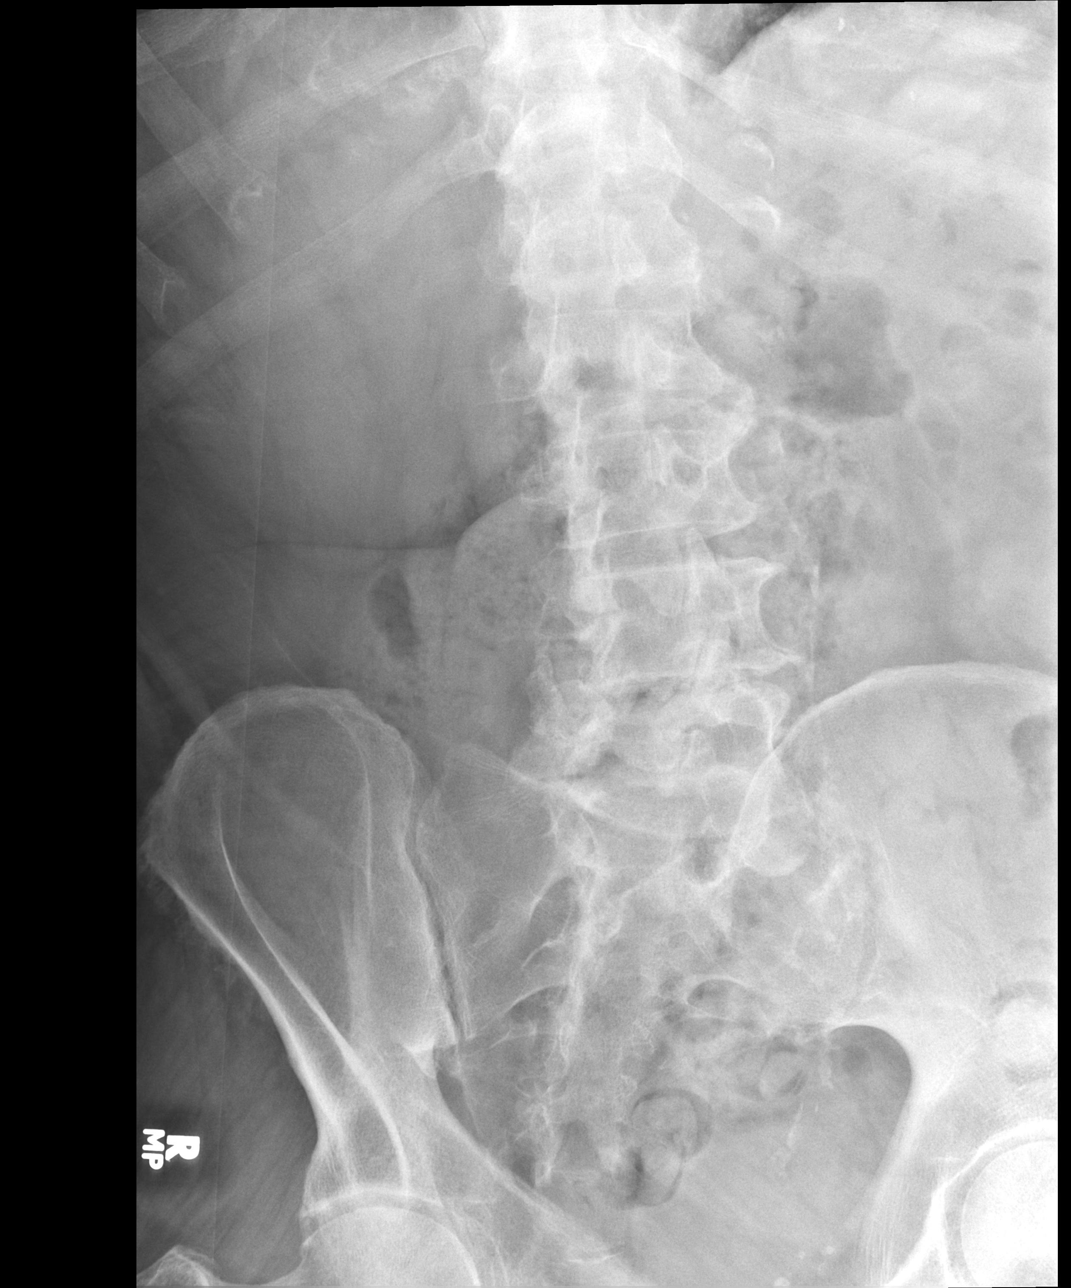

[4 of 4 positions shown; findings below may reference images not displayed]

FINDINGS: Lumbar vertebral heights are intact. 1 anterolisthesis at L4-5. Marked 
disc narrowing L4-5 and L5-S1. Less than grade 1 anterolisthesis L5-S1. Oblique 
views show no spondylolysis. There are advanced facet degenerative changes in 
the lower lumbar spine. Mild lumbar dextrocurvature.
IMPRESSION: Degenerative changes as described. Grade 1 anterolisthesis at L4-5, less than 
grade 1 L5-S1. MRI would be useful to evaluate for stenosis. 
Moderate aortoiliac plaque.

## 2023-08-30 IMAGING — MR MRI LUMBAR SPINE WITHOUT CONTRAST
6 of 9 series · 12 of 48 positions shown · IV contrast (gadolinium)
Comparison: X-ray lumbar spine from February 03, 2023.

________________________________________________________________________________________________ 
MRI LUMBAR SPINE WITHOUT CONTRAST, 08/30/2023 [DATE]: 
CLINICAL INDICATION: Chronic low back pain. On and off.
TECHNIQUE: Multiplanar, multiecho position MR images of the lumbar spine were 
performed without intravenous gadolinium enhancement. Patient was scanned on a 
1.5T magnet

[Series 101: survey · axial · 10.0mm · 1.25mm/px · z∈[-33,+201]mm · 2 of 10 slices shown]
[im 1/10]
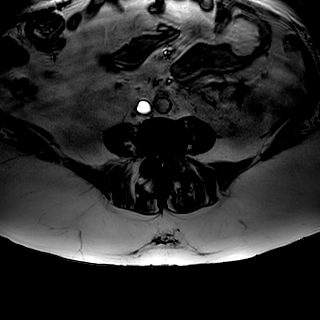
[im 10/10]
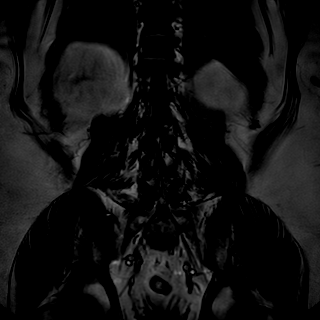

[Series 201: t2w_cor-surv · coronal · 6.0mm · 0.62mm/px · 2 of 10 slices shown]
[im 1/10]
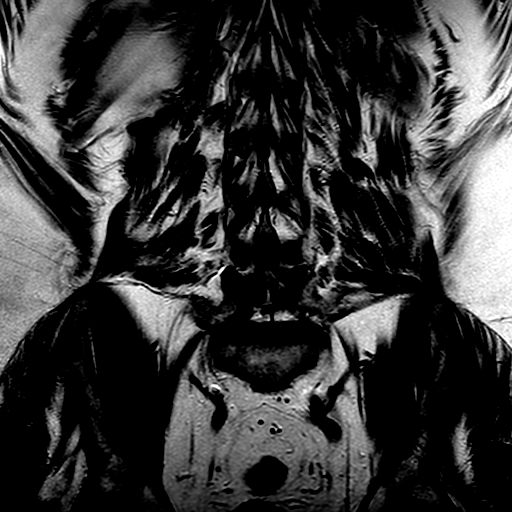
[im 10/10]
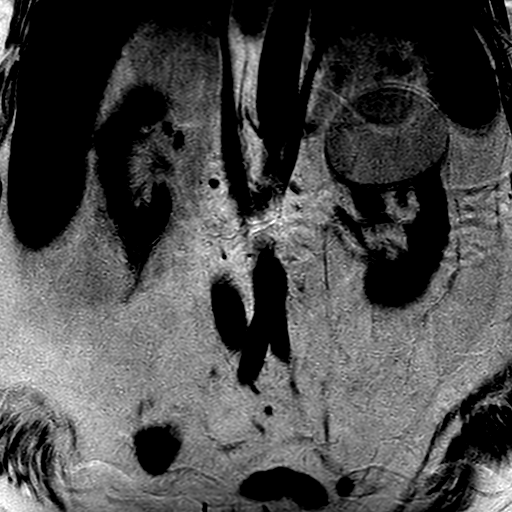

[Series 301: t1_tse_sag · sagittal · 4.0mm · 0.44mm/px · 2 of 20 slices shown]
[im 1/20]
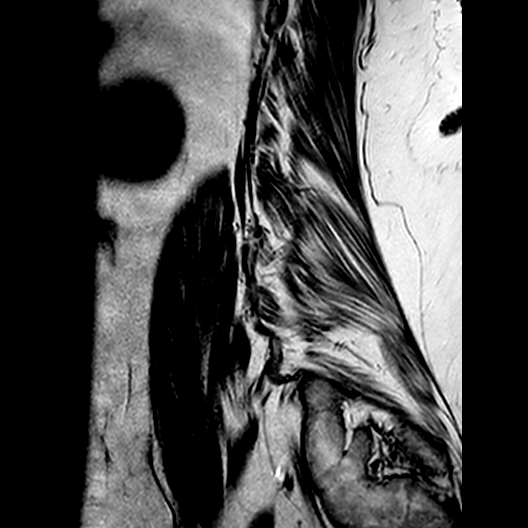
[im 20/20]
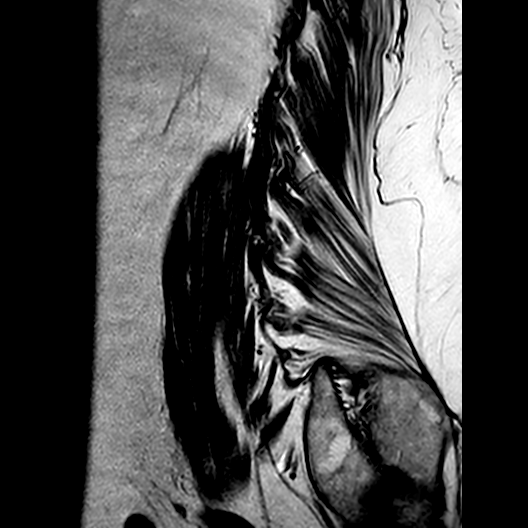

[Series 402: (id)_mdixon_tse · sagittal · 4.0mm · 0.50mm/px · 2 of 20 slices shown]
[im 1/20]
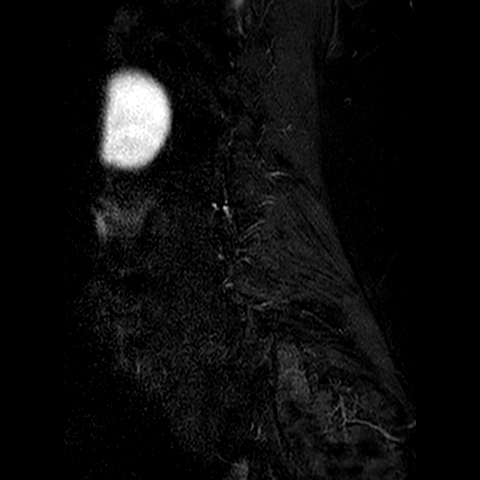
[im 20/20]
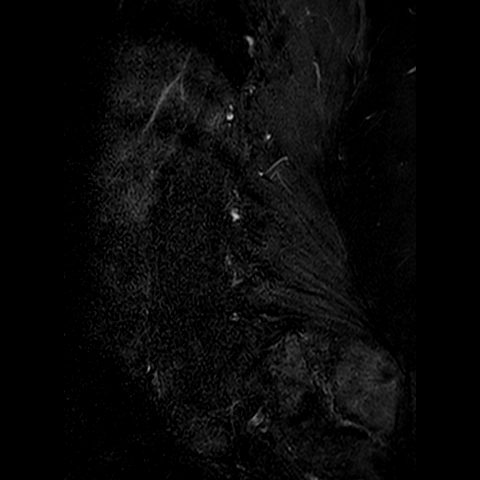

[Series 403: st2w_mdixon_tse · sagittal · 4.0mm · 0.50mm/px · 2 of 20 slices shown]
[im 1/20]
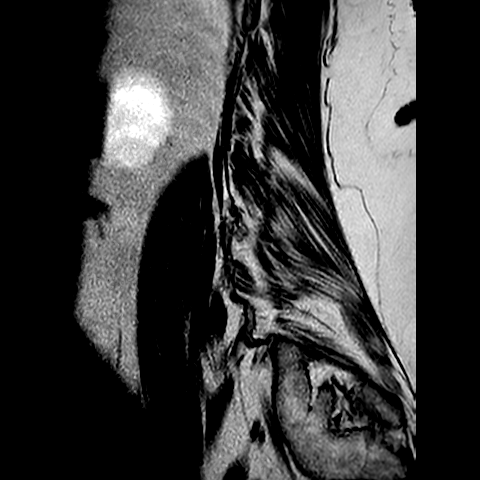
[im 20/20]
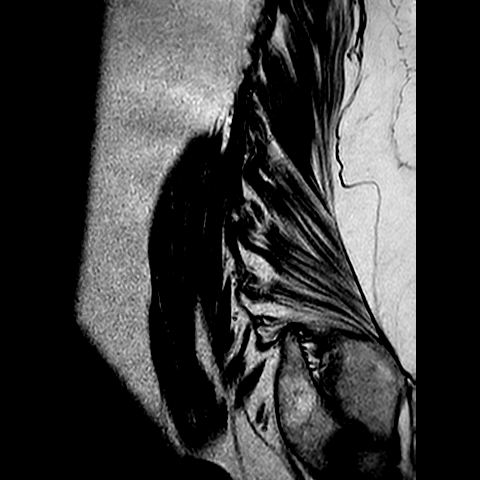

[Series 502: (id) view_ax mpr · axial · 1.0mm · 0.25mm/px · z∈[-51,-12]mm · 2 of 134 slices shown]
[im 9/134]
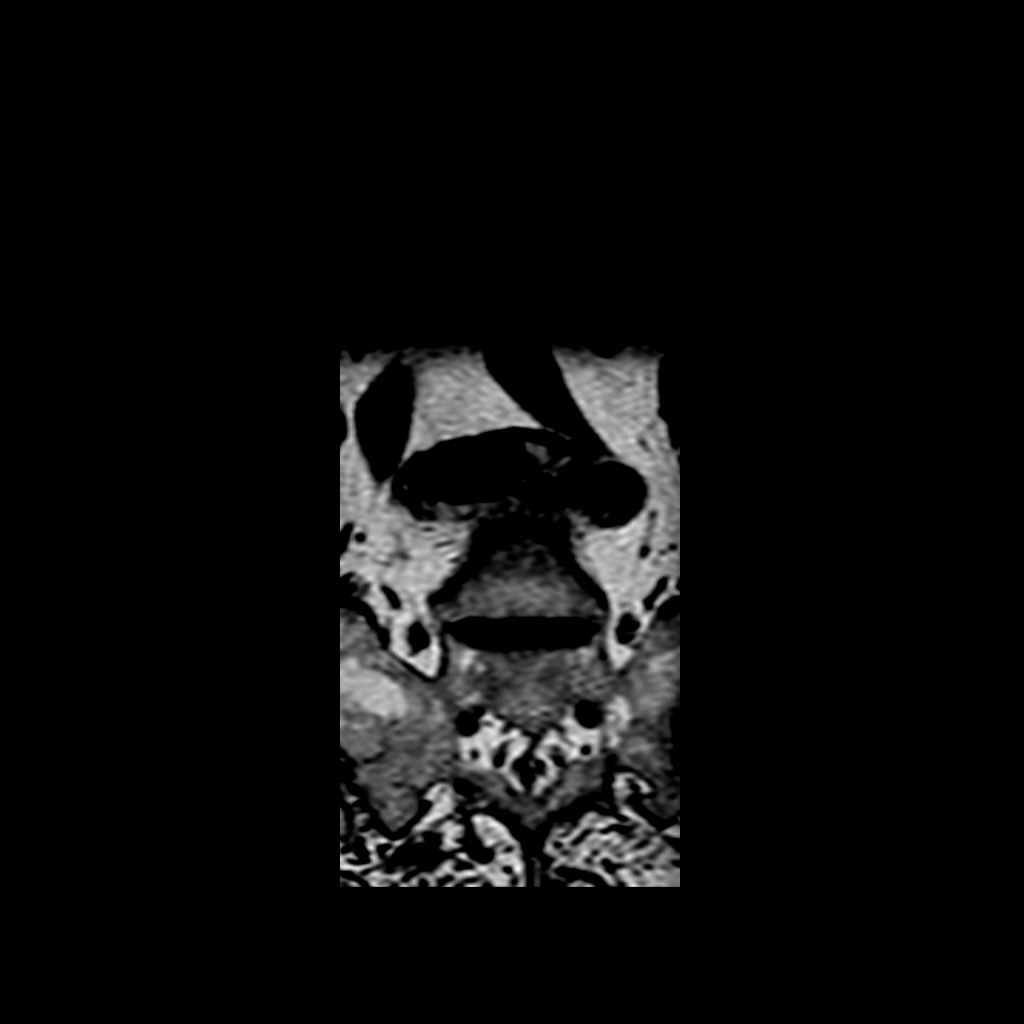
[im 25/134]
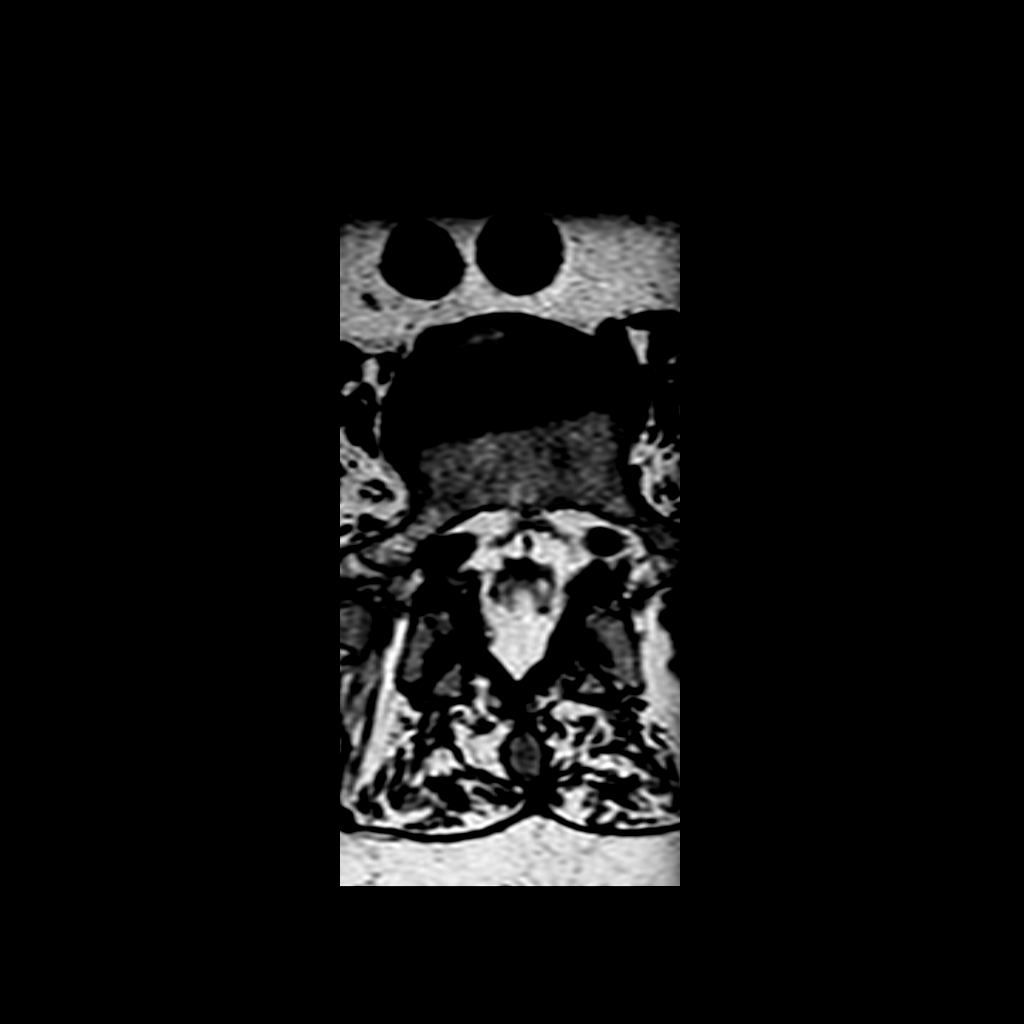

[12 of 48 positions shown; findings below may reference images not displayed]

FINDINGS: -------------------------------------------------------------------------------- 
------ 
GENERAL: 
Nomenclature is based on 5 lumbar type vertebral bodies.     
ALIGNMENT: Mild retrolisthesis of L2 on L3. Grade 1 anterolisthesis of L4 on L5, 
L5 on S1. Chronic bilateral L5 spondylolysis. 
VERTEBRAL BODY HEIGHT: Normal.  
MARROW SIGNAL: No focal suspect signal abnormality. 
CORD SIGNAL: Normal distal spinal cord and cauda equina.  Conus terminates at 
L1-L2 level. 
ADDITIONAL FINDINGS: Left renal cyst. Small right renal cysts including 
hemorrhagic versus proteinaceous right upper pole cyst. 
Modic I-II: Modic change at L4-L5 and L5-S1. 
Ligamentum Flavum > 2.5 mm: All levels. 
-------------------------------------------------------------------------------- 
------ 
SEGMENTAL: 
T12-L1: No significant central canal narrowing.  No significant right neural 
foraminal narrowing. No significant left neural foraminal narrowing.  
L1-L2: Trace disc bulge. No significant central canal narrowing.  No significant 
right neural foraminal narrowing. No significant left neural foraminal 
narrowing.  
L2-L3: Disc bulge with left central to subarticular disc herniation. Mild 
left-sided central canal narrowing with moderate left subarticular recess 
narrowing.  No significant right neural foraminal narrowing. Mild left neural 
foraminal narrowing.  
L3-L4: Disc bulge with bilateral facet and ligamentum flavum hypertrophy. Mild 
thecal sac narrowing noting prominent posterior epidural fat. Mild left 
subarticular recess narrowing.  No significant right neural foraminal narrowing. 
Mild left neural foraminal narrowing.  
L4-L5: Uncovering of the disc space with disc bulge. Bilateral facet and 
ligamentum flavum hypertrophy. Mild central canal narrowing.  No significant 
right neural foraminal narrowing. Moderate left neural foraminal narrowing.  
L5-S1: Loss of disc height with uncovering of the disc space. No significant 
central canal narrowing.  Severe right neural foraminal narrowing. Mild left 
neural foraminal narrowing.  
-------------------------------------------------------------------------------- 
------
IMPRESSION: 1.  Discogenic/degenerative changes as above. 
2.  Severe right neural foraminal narrowing L5-S1, please correlate for exiting 
right L5 nerve root radiculopathy. 
3.  Chronic L5 spondylolysis with spondylolisthesis.

## 2024-01-18 IMAGING — CT CT ABDOMEN W/WO CONTRAST
4 of 8 series · 11 of 46 positions shown, 16 images · IV contrast (isovue)
Comparison: None 
Count of known CT and Cardiac Nuclear Medicine studies performed in the previous 
12 months = 0.

________________________________________________________________________________________________ 
CT ABDOMEN W/WO CONTRAST, 01/18/2024 [DATE]: 
CLINICAL INDICATION: Cyst of kidney, acquired 
A search for DICOM formatted images was conducted for prior CT imaging studies 
completed at a non-affiliated media free facility.
TECHNIQUE: The abdomen was scanned from lung bases through the aortic 
bifurcation without and with 75 mL of Isovue 300 MDV injected intravenously on a 
high-resolution CT scanner using dose reduction techniques. Routine MPR 
reconstructions were performed. The patients eGFR was calculated to be
mL/min/1.73 m2 using the i-STAT device.

[Series 4: arterial with 3.0 i41s 2 · axial · arterial · 0.87mm/px · z∈[-310,-274]mm · 2 of 87 slices shown]
[im 13/87  soft-tissue]
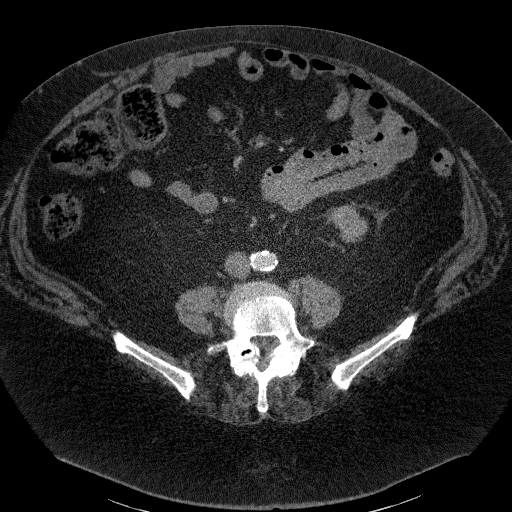
[im 25/87  soft-tissue]
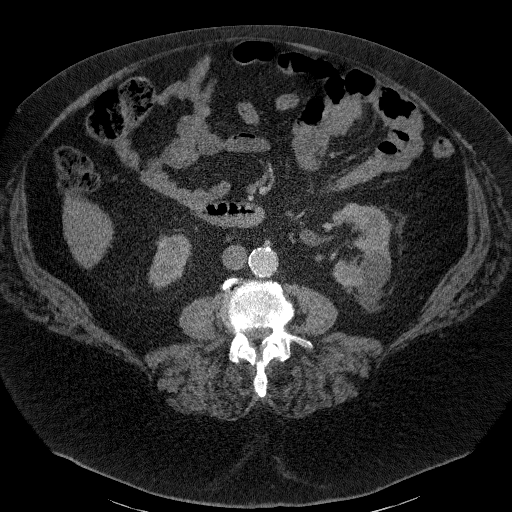

[Series 5: cor art · coronal · 0.56mm/px · 2 of 215 slices shown]
[im 72/215  soft-tissue]
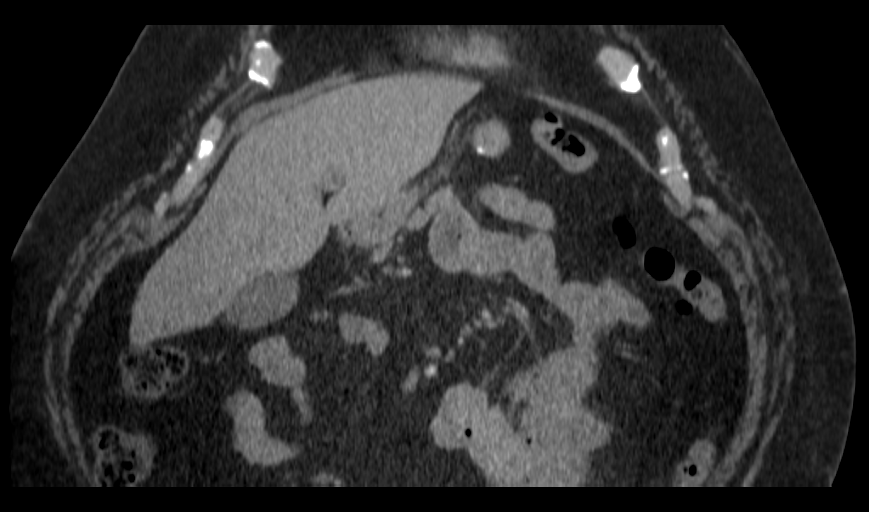
[im 143/215  soft-tissue]
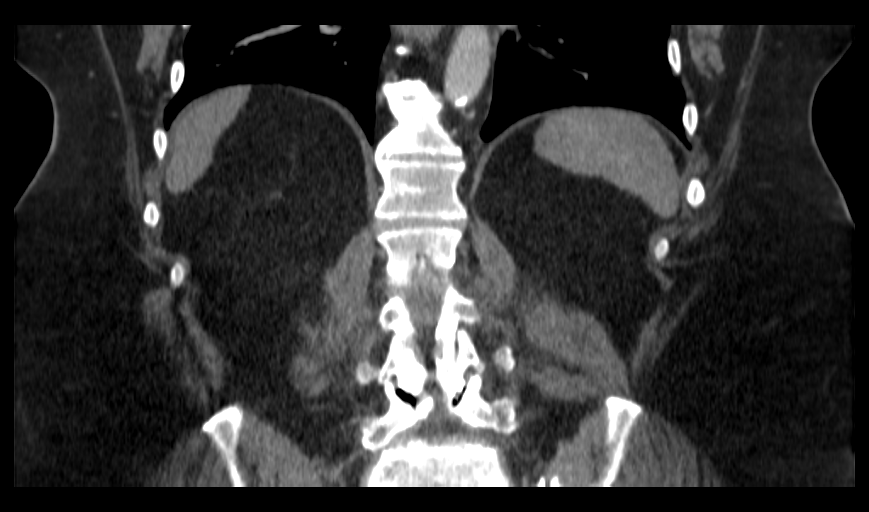

[Series 6: portal with 3.0 i41s 2 · axial · portal-venous · 0.87mm/px · z∈[-312,-126]mm · 6 of 88 slices shown, 11 images]
[im 13/88  soft-tissue]
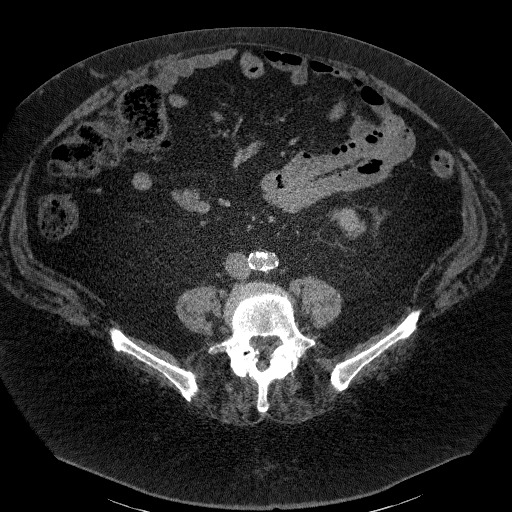
[im 13/88  bone]
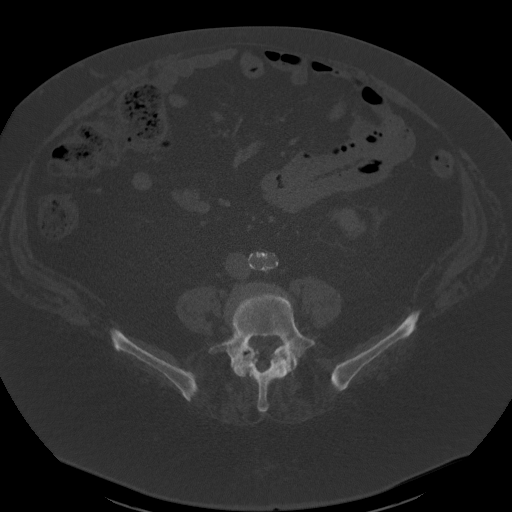
[im 25/88  soft-tissue]
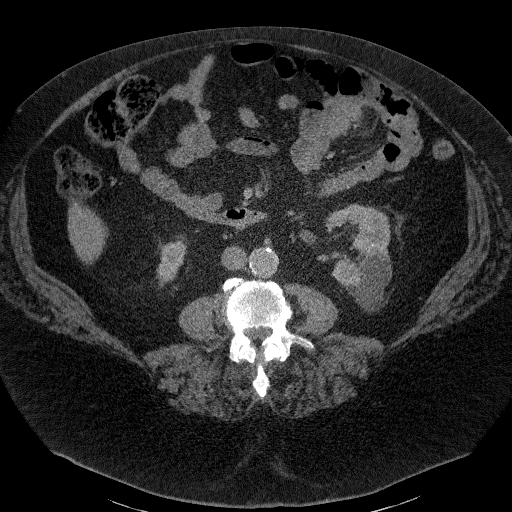
[im 38/88  soft-tissue]
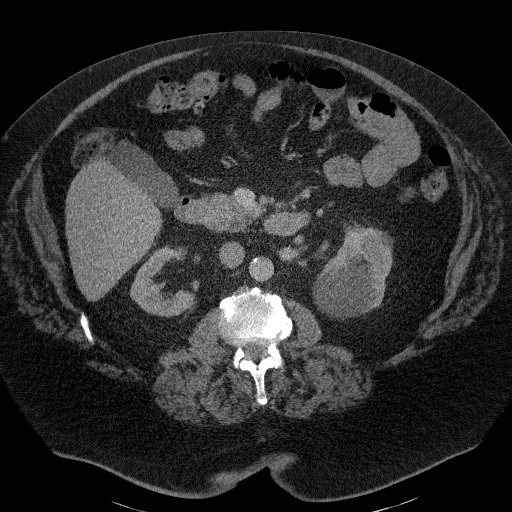
[im 38/88  lung]
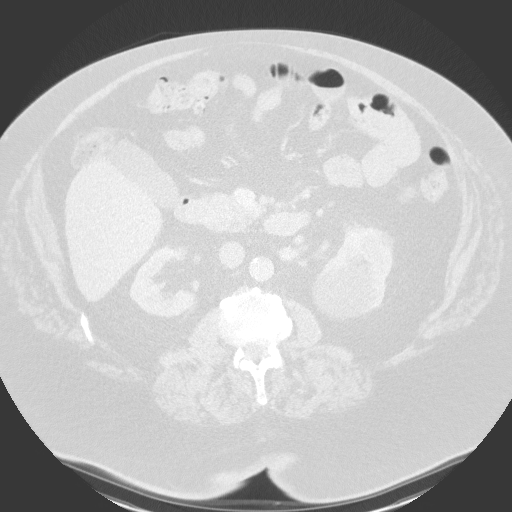
[im 50/88  soft-tissue]
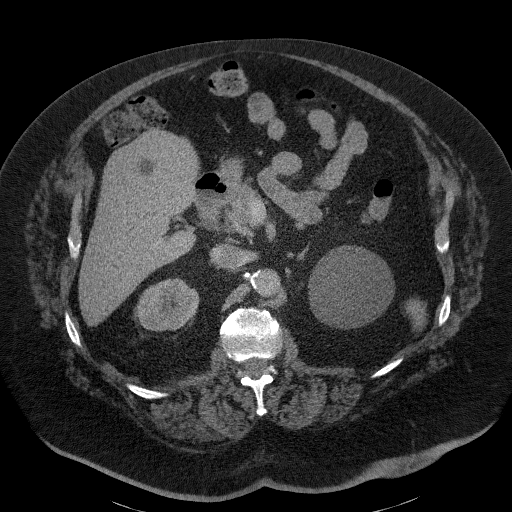
[im 50/88  lung]
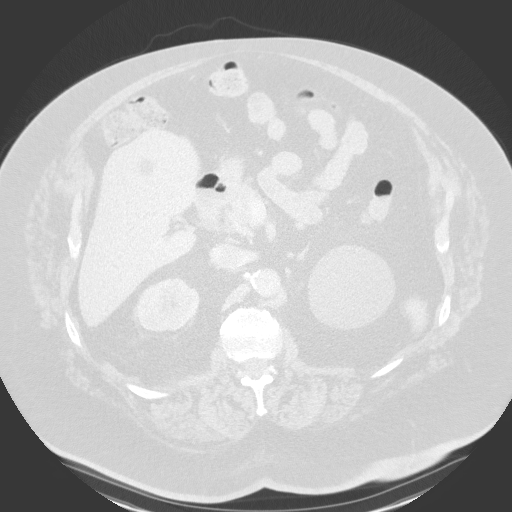
[im 63/88  soft-tissue]
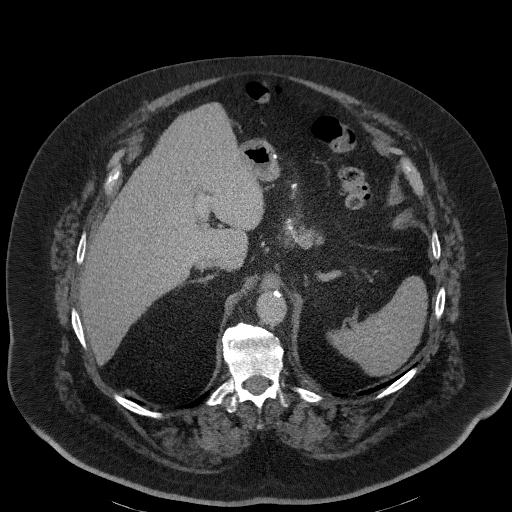
[im 63/88  lung]
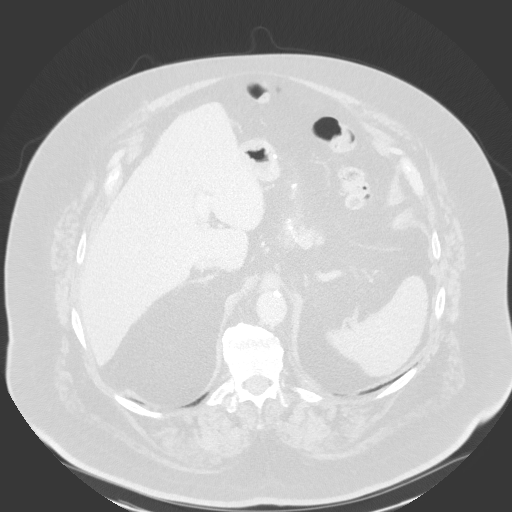
[im 75/88  soft-tissue]
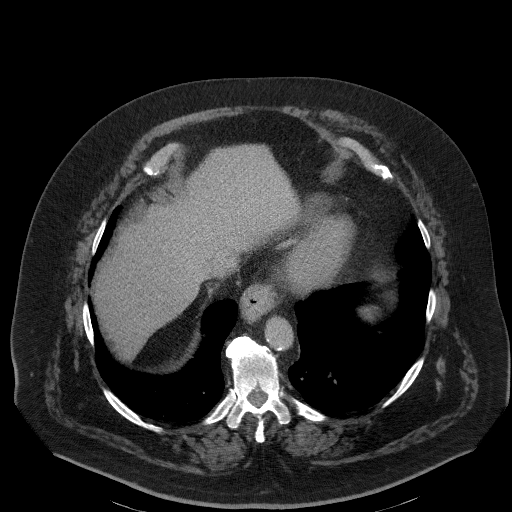
[im 75/88  lung]
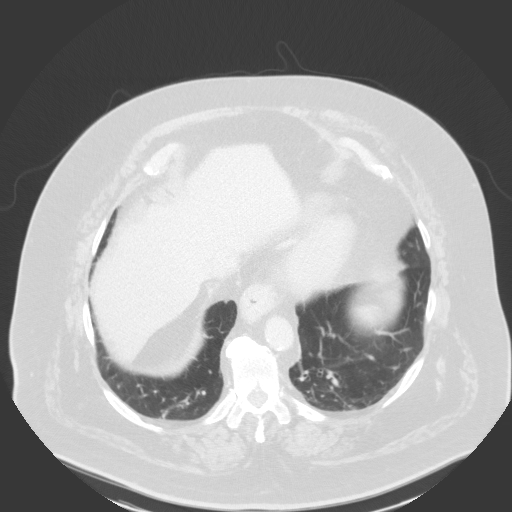

[Series 8: sag · sagittal · 0.51mm/px · 1 of 220 slices shown]
[im 74/220  soft-tissue]
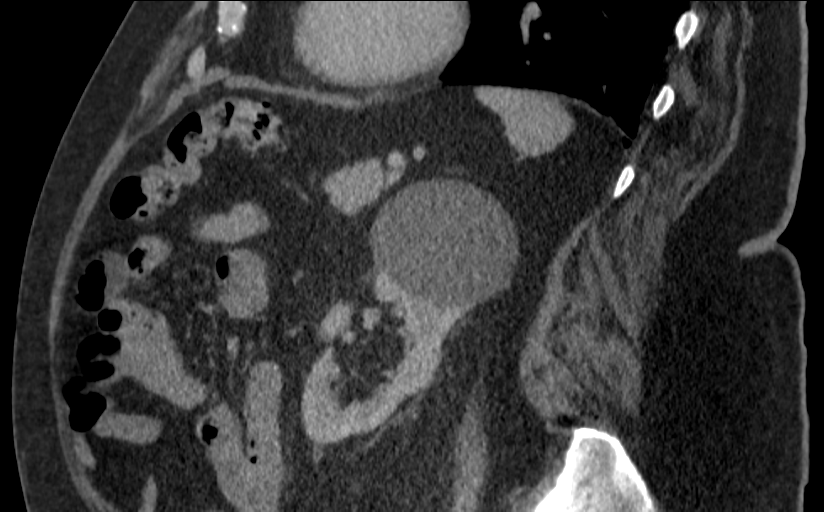

[11 of 46 positions shown; findings below may reference images not displayed]

FINDINGS: LUNG BASES: Mild linear scarring lung bases. No pleural effusions. 
HEPATOBILIARY: Liver cysts present. No liver mass and no intra or extra hepatic 
biliary ductal dilatation.No gallstones. 
SPLEEN: Normal in size. 
PANCREAS: No ductal dilatation or mass.   
ADRENALS: No mass. 
KIDNEYS:Multiple simple cysts seen in the left kidney measuring up to 7.4 cm. No 
mass or biliary dilatation. Atrophic right renal kidney. No enhancing mass or 
hydronephrosis. 
LYMPH NODES: No adenopathy. 
STOMACH, SMALL BOWEL AND COLON: No bowel wall thickening or obstruction. Small 
esophageal hiatal hernia. Stomach is decompressed and difficult to assess. 
Previous gastric bypass surgery 
VASCULAR STRUCTURES: No aneurysm. Moderate atherosclerotic changes of the celiac 
artery, superior mesenteric artery and right renal artery origins. 
MUSCULOSKELETAL: No acute osseous abnormality. Degenerative disc disease as well 
as degenerative facet changes at L4-5 and L5-S1. 
ADDITIONAL FINDINGS: None.
IMPRESSION: Multiple left renal cysts with atrophic right kidney. No echogenic calculi or 
hydronephrosis or mass in either kidney. 
Liver cysts present.  
Moderate atherosclerotic changes of the celiac artery, superior mesenteric 
artery and right renal artery origins. 
Degenerative disc disease as well as degenerative facet changes at L4-5 and 
L5-S1. 
RADIATION DOSE REDUCTION: All CT scans are performed using radiation dose 
reduction techniques, when applicable.  Technical factors are evaluated and 
adjusted to ensure appropriate moderation of exposure.  Automated dose 
management technology is applied to adjust the radiation doses to minimize 
exposure while achieving diagnostic quality images.
